# Patient Record
Sex: Female | Born: 1955 | Race: White | Hispanic: No | Marital: Single | Smoking: Never smoker
Health system: Southern US, Community
[De-identification: ages and names within clinical notes are randomized; demographics above are authoritative.]

## PROBLEM LIST (undated history)

## (undated) DIAGNOSIS — H9190 Unspecified hearing loss, unspecified ear: Secondary | ICD-10-CM

## (undated) DIAGNOSIS — I10 Essential (primary) hypertension: Secondary | ICD-10-CM

## (undated) HISTORY — DX: Unspecified hearing loss, unspecified ear: H91.90

## (undated) HISTORY — DX: Essential (primary) hypertension: I10

---

## 2018-02-17 LAB — CBC AND DIFFERENTIAL: Neutrophils Absolute: 3

## 2018-02-23 LAB — CBC AND DIFFERENTIAL
HCT: 39 (ref 36–46)
Hemoglobin: 13.4 (ref 12.0–16.0)
Neutrophils Absolute: 3
Platelets: 278 (ref 150–399)
WBC: 5.4

## 2018-02-23 LAB — BASIC METABOLIC PANEL
BUN: 12 (ref 4–21)
CO2: 22 (ref 13–22)
Chloride: 100 (ref 99–108)
Creatinine: 0.6 (ref <=1.1)
Glucose: 99
Potassium: 4.2 (ref 3.4–5.3)
Sodium: 139 (ref 137–147)

## 2018-02-23 LAB — COMPREHENSIVE METABOLIC PANEL
Albumin: 4.2 (ref 3.5–5.0)
Calcium: 9.3 (ref 8.7–10.7)
GFR calc Af Amer: 117
GFR calc non Af Amer: 102
Globulin: 2.5

## 2018-02-23 LAB — HEPATIC FUNCTION PANEL
ALT: 22 (ref 7–35)
AST: 24 (ref 13–35)
Alkaline Phosphatase: 73 (ref 25–125)
Bilirubin, Total: 0.5

## 2018-02-23 LAB — TSH: TSH: 1.73 (ref <=5.90)

## 2018-02-23 LAB — LIPID PANEL
Cholesterol: 194 (ref 0–200)
HDL: 76 — AB (ref 35–70)
LDL Cholesterol: 110
Triglycerides: 41 (ref 40–160)

## 2018-02-23 LAB — VITAMIN D 25 HYDROXY (VIT D DEFICIENCY, FRACTURES): Vit D, 25-Hydroxy: 51.7

## 2018-02-23 LAB — CBC: RBC: 4.14 (ref 3.87–5.11)

## 2019-02-14 LAB — CBC AND DIFFERENTIAL
HCT: 38 (ref 36–46)
Hemoglobin: 13.5 (ref 12.0–16.0)
Platelets: 316 (ref 150–399)
WBC: 5.7

## 2019-02-14 LAB — TSH: TSH: 1.14 (ref <=5.90)

## 2019-02-14 LAB — VITAMIN D 25 HYDROXY (VIT D DEFICIENCY, FRACTURES): Vit D, 25-Hydroxy: 75.2

## 2019-02-14 LAB — HEMOGLOBIN A1C: Hemoglobin A1C: 5.4

## 2019-02-14 LAB — CBC: RBC: 4.17 (ref 3.87–5.11)

## 2019-03-22 ENCOUNTER — Encounter (INDEPENDENT_AMBULATORY_CARE_PROVIDER_SITE_OTHER): Payer: BC Managed Care – PPO | Admitting: Family Medicine

## 2019-03-22 ENCOUNTER — Encounter: Payer: Self-pay | Admitting: Family Medicine

## 2019-03-22 ENCOUNTER — Ambulatory Visit: Payer: BC Managed Care – PPO | Admitting: Family Medicine

## 2019-03-22 ENCOUNTER — Other Ambulatory Visit: Payer: Self-pay

## 2019-03-22 VITALS — BP 130/78 | HR 62 | Temp 98.0°F | Ht 61.0 in | Wt 112.8 lb

## 2019-03-22 DIAGNOSIS — H9192 Unspecified hearing loss, left ear: Secondary | ICD-10-CM | POA: Diagnosis not present

## 2019-03-22 DIAGNOSIS — M8588 Other specified disorders of bone density and structure, other site: Secondary | ICD-10-CM | POA: Diagnosis not present

## 2019-03-22 DIAGNOSIS — M85859 Other specified disorders of bone density and structure, unspecified thigh: Secondary | ICD-10-CM | POA: Insufficient documentation

## 2019-03-22 DIAGNOSIS — Z1231 Encounter for screening mammogram for malignant neoplasm of breast: Secondary | ICD-10-CM

## 2019-03-22 DIAGNOSIS — H8109 Meniere's disease, unspecified ear: Secondary | ICD-10-CM

## 2019-03-22 DIAGNOSIS — Z0000 Encounter for general adult medical examination without abnormal findings: Secondary | ICD-10-CM

## 2019-03-22 NOTE — Patient Instructions (Signed)
DEXA Mammogram Derm referral

## 2019-03-22 NOTE — Progress Notes (Signed)
Established Patient Office Visit  Subjective:  Patient ID: Jamie Newman, female    DOB: 1955-11-16  Age: 63 y.o. MRN: [id number]  CC:  Chief Complaint  Patient presents with  . Establish Care    HPI Jamie Newman presents for  Social History   Socioeconomic History  . Marital status: Single    Spouse name: Not on file  . Number of children: Not on file  . Years of education: Not on file  . Highest education level: Not on file  Occupational History  . Not on file  Social Needs  . Financial resource strain: Not on file  . Food insecurity    Worry: Not on file    Inability: Not on file  . Transportation needs    Medical: Not on file    Non-medical: Not on file  Tobacco Use  . Smoking status: Not on file  Substance and Sexual Activity  . Alcohol use: Not on file  . Drug use: Not on file  . Sexual activity: Not on file  Lifestyle  . Physical activity    Days per week: Not on file    Minutes per session: Not on file  . Stress: Not on file  Relationships  . Social Herbalist on phone: Not on file    Gets together: Not on file    Attends religious service: Not on file    Active member of club or organization: Not on file    Attends meetings of clubs or organizations: Not on file    Relationship status: Not on file  . Intimate partner violence    Fear of current or ex partner: Not on file    Emotionally abused: Not on file    Physically abused: Not on file    Forced sexual activity: Not on file  Other Topics Concern  . Not on file  Social History Narrative  . Not on file    ROS Review of Systems  Constitutional: Negative.   HENT: Positive for hearing loss and tinnitus.   Eyes: Negative.   Gastrointestinal: Negative.   Endocrine: Negative.   Genitourinary:       Estrogen/progesterone/testosterone-weaning  Musculoskeletal: Negative.   Skin: Negative.   Allergic/Immunologic: Negative.   Neurological:       Vertigo  Hematological: Negative.    Psychiatric/Behavioral: Negative.       Objective:    Physical Exam  Constitutional: She is oriented to person, place, and time. She appears well-developed and well-nourished. No distress.  HENT:  Head: Normocephalic and atraumatic.  Eyes: Conjunctivae are normal.  Neck: Normal range of motion. Neck supple.  Cardiovascular: Normal rate and regular rhythm.  Pulmonary/Chest: Effort normal and breath sounds normal.  Abdominal: Bowel sounds are normal.  Musculoskeletal: Normal range of motion.  Neurological: She is oriented to person, place, and time.  Psychiatric: She has a normal mood and affect.    BP 130/78 (BP Location: Left Arm, Patient Position: Sitting, Cuff Size: Normal)   Pulse 62   Temp 98 F (36.7 C) (Oral)   Ht 5\' 1"  (1.549 m)   Wt 112 lb 12.8 oz (51.2 kg)   SpO2 97%   BMI 21.31 kg/m  Wt Readings from Last 3 Encounters:  03/22/19 112 lb 12.8 oz (51.2 kg)     Health Maintenance Due  Topic Date Due  . Hepatitis C Screening  16-May-1955  . HIV Screening  12/28/1972  . TETANUS/TDAP  12/28/1976  . PAP SMEAR-Modifier  12/29/1978  . MAMMOGRAM  12/29/2007  . COLONOSCOPY  12/29/2007  . INFLUENZA VACCINE  11/18/2018    Assessment & Plan:   1. Encounter for screening mammogram for malignant neoplasm of breast - MM Digital Screening; Future - DG DXA FRACTURE ASSESSMENT; Future 2. Osteopenia of lumbar spine DEXA-rx 3. Health maintenance examination - Ambulatory referral to Dermatology 4. Hearing loss of left ear, unspecified hearing loss type Causes vertigo in the past-seen by ENT-took valium for treatment  5. Meniere's disease, unspecified laterality Follow-up: as needed   Jamie Clinkenbeard Mat Carne, MD

## 2019-03-24 DIAGNOSIS — H9192 Unspecified hearing loss, left ear: Secondary | ICD-10-CM | POA: Insufficient documentation

## 2019-03-24 DIAGNOSIS — H8109 Meniere's disease, unspecified ear: Secondary | ICD-10-CM | POA: Insufficient documentation

## 2019-05-09 ENCOUNTER — Other Ambulatory Visit: Payer: Self-pay | Admitting: Family Medicine

## 2019-05-09 DIAGNOSIS — M8580 Other specified disorders of bone density and structure, unspecified site: Secondary | ICD-10-CM

## 2019-05-16 ENCOUNTER — Ambulatory Visit (HOSPITAL_COMMUNITY)
Admission: RE | Admit: 2019-05-16 | Discharge: 2019-05-16 | Disposition: A | Discharge status: Home / Self Care | Payer: BC Managed Care – PPO | Source: Ambulatory Visit | Attending: Family Medicine | Admitting: Family Medicine

## 2019-05-16 ENCOUNTER — Other Ambulatory Visit: Payer: Self-pay

## 2019-05-16 DIAGNOSIS — M8580 Other specified disorders of bone density and structure, unspecified site: Secondary | ICD-10-CM

## 2019-05-16 DIAGNOSIS — Z780 Asymptomatic menopausal state: Secondary | ICD-10-CM | POA: Insufficient documentation

## 2019-05-16 DIAGNOSIS — Z13820 Encounter for screening for osteoporosis: Secondary | ICD-10-CM | POA: Diagnosis not present

## 2019-05-16 DIAGNOSIS — Z1231 Encounter for screening mammogram for malignant neoplasm of breast: Secondary | ICD-10-CM

## 2019-05-17 ENCOUNTER — Telehealth: Payer: Self-pay | Admitting: Family Medicine

## 2019-05-17 NOTE — Telephone Encounter (Signed)
Patient is calling and requesting the results from her Bone Density and would like the comparison from her last scan.

## 2019-05-17 NOTE — Telephone Encounter (Signed)
Patient is scheduled for a virtual visit on Monday 05/21/2019

## 2019-05-21 ENCOUNTER — Telehealth (INDEPENDENT_AMBULATORY_CARE_PROVIDER_SITE_OTHER): Payer: BC Managed Care – PPO | Admitting: Family Medicine

## 2019-05-21 ENCOUNTER — Other Ambulatory Visit: Payer: Self-pay

## 2019-05-21 VITALS — BP 130/78 | Ht 61.0 in | Wt 112.0 lb

## 2019-05-21 DIAGNOSIS — M8588 Other specified disorders of bone density and structure, other site: Secondary | ICD-10-CM

## 2019-05-21 DIAGNOSIS — M85859 Other specified disorders of bone density and structure, unspecified thigh: Secondary | ICD-10-CM | POA: Diagnosis not present

## 2019-05-21 NOTE — Patient Instructions (Signed)
Follow up for repeat Bone Density in 2 years  COVID-19 Vaccine Information can be found at: [email or web address] For questions related to vaccine distribution or appointments, please email [email or web address] or call [phone].

## 2019-05-21 NOTE — Progress Notes (Signed)
Virtual Visit via Telephone Note  I connected with Jamie Newman on 05/21/19 at  9:40 AM EST by telephone and verified that I am speaking with the correct person using two identifiers.DOB/address  Location: Patient: home Provider: office   I discussed the limitations, risks, security and privacy concerns of performing an evaluation and management service by telephone and the availability of in person appointments. I also discussed with the patient that there may be a patient responsible charge related to this service. The patient expressed understanding and agreed to proceed.   History of Present Illness: Osteopenia-DEXA scan-takes Vit D and Calcium  increasing her walking Observations/Objective: AP Spine L1-L4 05/16/2019 [age] Osteopenia -2.3 0.898 g/cm2  DualFemur Neck Left 05/16/2019 [age] Osteopenia -1.7 0.799 g/cm2 ASSESSMENT: BMD as determined from AP Spine L1-L4 is 0.898 g/cm2 with a T-Score of -2.3. This patient is considered osteopenic according to World Health Organization [facility]) criteria. AP Spine L1-L4 05/16/2019 [age] Osteopenia -2.3 0.898 g/cm2  DualFemur Neck Left 05/16/2019 [age] Osteopenia -1.7 0.799 g/cm2 ASSESSMENT: BMD as determined from AP Spine L1-L4 is 0.898 g/cm2 with a T-Score of -2.3. This patient is considered osteopenic according to World Health Organization [facility]) criteria.   Assessment and Plan: 1. Osteopenia of lumbar spine Ca/VIt D  2. Osteopenia of neck of femur, unspecified laterality Ca/Vit D-core strengthening  Follow Up Instructions: Repeat in 2 years Core strengthening discussed-Yoga, continue walking, upper body light weight training   I discussed the assessment and treatment plan with the patient. The patient was provided an opportunity to ask questions and all were answered. The patient agreed with the plan and demonstrated an understanding of the instructions.   I provided 7 minutes of non-face-to-face time during this encounter.   Cornie Herrington  Mat Carne, MD

## 2019-05-22 ENCOUNTER — Ambulatory Visit: Payer: BC Managed Care – PPO | Attending: Internal Medicine

## 2019-05-22 ENCOUNTER — Other Ambulatory Visit: Payer: Self-pay

## 2019-05-22 DIAGNOSIS — Z20822 Contact with and (suspected) exposure to covid-19: Secondary | ICD-10-CM

## 2019-05-23 LAB — NOVEL CORONAVIRUS, NAA: SARS-CoV-2, NAA: NOT DETECTED

## 2019-05-28 ENCOUNTER — Other Ambulatory Visit: Payer: Self-pay | Admitting: Family Medicine

## 2019-05-28 ENCOUNTER — Inpatient Hospital Stay
Admission: RE | Admit: 2019-05-28 | Discharge: 2019-05-28 | Disposition: A | Discharge status: Home / Self Care | Payer: Self-pay | Source: Ambulatory Visit | Attending: Family Medicine | Admitting: Family Medicine

## 2019-05-28 DIAGNOSIS — Z1231 Encounter for screening mammogram for malignant neoplasm of breast: Secondary | ICD-10-CM

## 2019-05-30 ENCOUNTER — Telehealth: Payer: Self-pay | Admitting: Family Medicine

## 2019-05-30 NOTE — Telephone Encounter (Signed)
Patient is calling and states that she had a Bone Density on 05/16/19 and states her insurance did not cover it because it was coded as bone disorder M85.89 she states that it shouldve been charged as a preventive screening as patient states she gets them done every 2 years.

## 2019-05-30 NOTE — Telephone Encounter (Signed)
If pt had a previous h/o osteopenia , DEXA is no longer a screening test but a diagnostic test

## 2019-05-30 NOTE — Telephone Encounter (Signed)
Routing to Dr. Judee Clara I dont do the coding

## 2019-06-04 NOTE — Telephone Encounter (Signed)
Can you call this patient. I coded for osteopenia rather than screening test since diagnostic rather than screening. Can you check on this and call back

## 2019-06-04 NOTE — Telephone Encounter (Signed)
Patient is calling back to check on this.

## 2019-06-08 ENCOUNTER — Telehealth: Payer: Self-pay

## 2019-06-08 NOTE — Telephone Encounter (Signed)
Called and lmom again - bill has gone to reprocess

## 2019-06-08 NOTE — Telephone Encounter (Signed)
Called pt and let her know we are having the bill re-processed.

## 2019-06-08 NOTE — Telephone Encounter (Signed)
PT LVM that she needed to talk to you regarding this

## 2019-06-08 NOTE — Telephone Encounter (Signed)
Called [facility] and they said this needs to be coded as M85.80 if it is Osteopenia.  I have emailed Belva Bertin in [facility] billing and asked her to re file the claim with the corrected code

## 2019-06-11 ENCOUNTER — Telehealth: Payer: Self-pay | Admitting: Family Medicine

## 2019-06-11 ENCOUNTER — Telehealth: Payer: Self-pay

## 2019-06-11 ENCOUNTER — Other Ambulatory Visit: Payer: Self-pay | Admitting: Emergency Medicine

## 2019-06-11 DIAGNOSIS — I10 Essential (primary) hypertension: Secondary | ICD-10-CM

## 2019-06-11 MED ORDER — LOSARTAN POTASSIUM 50 MG PO TABS: 50.0000 mg | ORAL_TABLET | Freq: Every day | ORAL | 1 refills | Status: AC

## 2019-06-11 NOTE — Telephone Encounter (Signed)
Screening mammogram unless the patient had abnormality? Do we need to talk with a biller about his patient?

## 2019-06-11 NOTE — Telephone Encounter (Signed)
Patient is calling and requesting a refill on losartan (COZAAR) 50 MG tablet   [facility] - [place], [place] - [address] Phone:  [phone]  Fax:  [phone]

## 2019-06-11 NOTE — Telephone Encounter (Signed)
Sent note to billing per Dr Judee Clara

## 2019-06-11 NOTE — Telephone Encounter (Signed)
Rx has been sent to the pharmacy

## 2019-06-11 NOTE — Telephone Encounter (Signed)
Pt called again and asked for the dx on her mammogram be corrected to diagnostic.   Can you check and let me know what DX you want to put on this referral.  She is having problems getting this paid.  She said the code is not correct.  I told her I can not change the code that the providers select the code.

## 2019-06-27 NOTE — Telephone Encounter (Signed)
Pt called again today.  I have sent another message to charge correction today and ask for this to be reviewed again.  I am not sure if it have been re billed or not.  E-mail sent again today.     06/27/19 charge correction email:  I do not think this has been corrected.  Patient called again today.  Can you please check on this and let me know the status?  The patient is getting a delinquent bill from [facility] that the insurance has not paid the claim.   "Per Dr Judee Clara this should have a DX of M85.80" Diagnostic  Jamie Newman [place] [id number] DOS 05/16/19  -------------------------------------------------------------------------------------- Jamie Newman [place] [id number] DOS 05/16/19  Had a bone density at [facility].  Per Dr Judee Clara this should have a DX of M85.80.  The procedure was not paid with the incorrect dx.  Please file a corrected claim and this claim should be paid.  Thanks for your help.    From: Jamie Newman [email or web address]>  Sent: Friday, June 08, 2019 1:12 PM To: Jamie Newman [email or web address]> Subject: RE: billing error  Good afternoon, Jamie Newman.   You will need to submit an update for correction to the charge correction [email or web address]) email box. Please provide the patient name, Guarantor ID, DOB, and DOS with the information provide below regarding the correction.   Thank you,   Jamie Newman, MHA, [facility] [place]   SW-Professional Billing SW Professional Billing Manager Office: [phone]  Fax: [phone] [email or web address]

## 2019-07-18 ENCOUNTER — Telehealth: Payer: Self-pay

## 2019-07-18 NOTE — Telephone Encounter (Signed)
Info back from billing.  Sent to Valentine Nifong 07/18/19  From: Lianne Cure [email or web address]>  Sent: Wednesday, July 18, 2019 10:34 AM To: Jory Sims @[email or web address]> Subject: Re: secure FW: billing error  This appears to be a hospital account and we do not handle those accounts.  This needs to be sent to [facility].    Thanks,   Lianne Cure [place]

## 2019-12-05 ENCOUNTER — Other Ambulatory Visit: Payer: BC Managed Care – PPO

## 2019-12-05 ENCOUNTER — Other Ambulatory Visit: Payer: Self-pay

## 2019-12-05 DIAGNOSIS — Z20822 Contact with and (suspected) exposure to covid-19: Secondary | ICD-10-CM

## 2019-12-06 LAB — SARS-COV-2, NAA 2 DAY TAT

## 2019-12-06 LAB — NOVEL CORONAVIRUS, NAA: SARS-CoV-2, NAA: NOT DETECTED

## 2020-01-01 ENCOUNTER — Other Ambulatory Visit: Payer: BC Managed Care – PPO

## 2020-01-01 ENCOUNTER — Other Ambulatory Visit: Payer: Self-pay

## 2020-01-01 DIAGNOSIS — Z20822 Contact with and (suspected) exposure to covid-19: Secondary | ICD-10-CM

## 2020-01-04 LAB — NOVEL CORONAVIRUS, NAA: SARS-CoV-2, NAA: NOT DETECTED

## 2020-01-04 LAB — SPECIMEN STATUS REPORT

## 2020-01-19 ENCOUNTER — Other Ambulatory Visit: Payer: BC Managed Care – PPO

## 2020-01-19 ENCOUNTER — Other Ambulatory Visit: Payer: Self-pay

## 2020-01-19 DIAGNOSIS — Z20822 Contact with and (suspected) exposure to covid-19: Secondary | ICD-10-CM

## 2020-01-21 LAB — SARS-COV-2, NAA 2 DAY TAT

## 2020-01-21 LAB — NOVEL CORONAVIRUS, NAA: SARS-CoV-2, NAA: NOT DETECTED

## 2020-03-22 ENCOUNTER — Other Ambulatory Visit: Payer: BC Managed Care – PPO

## 2020-03-22 DIAGNOSIS — Z20822 Contact with and (suspected) exposure to covid-19: Secondary | ICD-10-CM

## 2020-03-24 LAB — SARS-COV-2, NAA 2 DAY TAT

## 2020-03-24 LAB — NOVEL CORONAVIRUS, NAA: SARS-CoV-2, NAA: NOT DETECTED

## 2020-04-03 ENCOUNTER — Other Ambulatory Visit: Payer: BC Managed Care – PPO

## 2020-04-03 DIAGNOSIS — Z20822 Contact with and (suspected) exposure to covid-19: Secondary | ICD-10-CM

## 2020-04-04 LAB — NOVEL CORONAVIRUS, NAA: SARS-CoV-2, NAA: NOT DETECTED

## 2020-04-04 LAB — SARS-COV-2, NAA 2 DAY TAT

## 2020-04-22 ENCOUNTER — Other Ambulatory Visit: Payer: Self-pay

## 2020-04-22 DIAGNOSIS — Z20822 Contact with and (suspected) exposure to covid-19: Secondary | ICD-10-CM

## 2020-04-24 LAB — SARS-COV-2, NAA 2 DAY TAT

## 2020-04-24 LAB — NOVEL CORONAVIRUS, NAA: SARS-CoV-2, NAA: DETECTED — AB

## 2020-04-27 ENCOUNTER — Other Ambulatory Visit: Payer: Self-pay

## 2020-10-17 ENCOUNTER — Other Ambulatory Visit (HOSPITAL_COMMUNITY): Payer: Self-pay | Admitting: Internal Medicine

## 2020-10-17 DIAGNOSIS — Z1231 Encounter for screening mammogram for malignant neoplasm of breast: Secondary | ICD-10-CM

## 2020-10-30 ENCOUNTER — Ambulatory Visit (HOSPITAL_COMMUNITY): Payer: Self-pay

## 2020-10-31 ENCOUNTER — Other Ambulatory Visit: Payer: Self-pay

## 2020-10-31 ENCOUNTER — Ambulatory Visit (HOSPITAL_COMMUNITY)
Admission: RE | Admit: 2020-10-31 | Discharge: 2020-10-31 | Disposition: A | Discharge status: Home / Self Care | Payer: BC Managed Care – PPO | Source: Ambulatory Visit | Attending: Internal Medicine | Admitting: Internal Medicine

## 2020-10-31 DIAGNOSIS — Z1231 Encounter for screening mammogram for malignant neoplasm of breast: Secondary | ICD-10-CM | POA: Diagnosis present

## 2020-11-03 ENCOUNTER — Ambulatory Visit (HOSPITAL_COMMUNITY): Payer: Self-pay

## 2021-05-14 ENCOUNTER — Other Ambulatory Visit (HOSPITAL_COMMUNITY): Payer: Self-pay | Admitting: Family Medicine

## 2021-05-14 DIAGNOSIS — Z13820 Encounter for screening for osteoporosis: Secondary | ICD-10-CM

## 2021-08-12 ENCOUNTER — Other Ambulatory Visit: Payer: Self-pay

## 2021-08-12 ENCOUNTER — Emergency Department (HOSPITAL_BASED_OUTPATIENT_CLINIC_OR_DEPARTMENT_OTHER): Payer: BC Managed Care – PPO

## 2021-08-12 ENCOUNTER — Emergency Department (HOSPITAL_BASED_OUTPATIENT_CLINIC_OR_DEPARTMENT_OTHER)
Admission: EM | Admit: 2021-08-12 | Discharge: 2021-08-12 | Disposition: A | Discharge status: Home / Self Care | Payer: BC Managed Care – PPO | Source: Home / Self Care | Attending: Emergency Medicine | Admitting: Emergency Medicine

## 2021-08-12 ENCOUNTER — Encounter (HOSPITAL_BASED_OUTPATIENT_CLINIC_OR_DEPARTMENT_OTHER): Payer: Self-pay

## 2021-08-12 DIAGNOSIS — N151 Renal and perinephric abscess: Secondary | ICD-10-CM | POA: Insufficient documentation

## 2021-08-12 DIAGNOSIS — N12 Tubulo-interstitial nephritis, not specified as acute or chronic: Secondary | ICD-10-CM | POA: Insufficient documentation

## 2021-08-12 LAB — CBC WITH DIFFERENTIAL/PLATELET
Abs Immature Granulocytes: 0.06 10*3/uL (ref 0.00–0.07)
Basophils Absolute: 0 10*3/uL (ref 0.0–0.1)
Basophils Relative: 0 %
Eosinophils Absolute: 0 10*3/uL (ref 0.0–0.5)
Eosinophils Relative: 0 %
HCT: 38 % (ref 36.0–46.0)
Hemoglobin: 12.9 g/dL (ref 12.0–15.0)
Immature Granulocytes: 1 %
Lymphocytes Relative: 9 %
Lymphs Abs: 1.2 10*3/uL (ref 0.7–4.0)
MCH: 31.6 pg (ref 26.0–34.0)
MCHC: 33.9 g/dL (ref 30.0–36.0)
MCV: 93.1 fL (ref 80.0–100.0)
Monocytes Absolute: 1.1 10*3/uL — ABNORMAL HIGH (ref 0.1–1.0)
Monocytes Relative: 9 %
Neutro Abs: 10.3 10*3/uL — ABNORMAL HIGH (ref 1.7–7.7)
Neutrophils Relative %: 81 %
Platelets: 257 10*3/uL (ref 150–400)
RBC: 4.08 MIL/uL (ref 3.87–5.11)
RDW: 12 % (ref 11.5–15.5)
WBC: 12.7 10*3/uL — ABNORMAL HIGH (ref 4.0–10.5)
nRBC: 0 % (ref 0.0–0.2)

## 2021-08-12 LAB — COMPREHENSIVE METABOLIC PANEL
ALT: 34 U/L (ref 0–44)
AST: 33 U/L (ref 15–41)
Albumin: 4.4 g/dL (ref 3.5–5.0)
Alkaline Phosphatase: 79 U/L (ref 38–126)
Anion gap: 13 (ref 5–15)
BUN: 13 mg/dL (ref 8–23)
CO2: 23 mmol/L (ref 22–32)
Calcium: 10 mg/dL (ref 8.9–10.3)
Chloride: 98 mmol/L (ref 98–111)
Creatinine, Ser: 0.56 mg/dL (ref 0.44–1.00)
GFR, Estimated: >60 mL/min (ref >60)
Glucose, Bld: 100 mg/dL — ABNORMAL HIGH (ref 70–99)
Potassium: 3.8 mmol/L (ref 3.5–5.1)
Sodium: 134 mmol/L — ABNORMAL LOW (ref 135–145)
Total Bilirubin: 1 mg/dL (ref 0.3–1.2)
Total Protein: 7.7 g/dL (ref 6.5–8.1)

## 2021-08-12 LAB — URINALYSIS, ROUTINE W REFLEX MICROSCOPIC
Bilirubin Urine: NEGATIVE
Glucose, UA: NEGATIVE mg/dL
Hgb urine dipstick: NEGATIVE
Nitrite: NEGATIVE
Specific Gravity, Urine: 1.015 (ref 1.005–1.030)
pH: 6 (ref 5.0–8.0)

## 2021-08-12 LAB — LACTIC ACID, PLASMA: Lactic Acid, Venous: 1 mmol/L (ref 0.5–1.9)

## 2021-08-12 LAB — LIPASE, BLOOD: Lipase: 10 U/L — ABNORMAL LOW (ref 11–51)

## 2021-08-12 MED ORDER — SODIUM CHLORIDE 0.9 % IV SOLN
2.0000 g | Freq: Once | INTRAVENOUS | Status: AC
  Administered 2021-08-12: 2 g via INTRAVENOUS
  Filled 2021-08-12: qty 20

## 2021-08-12 MED ORDER — SODIUM CHLORIDE 0.9 % IV BOLUS
1000.0000 mL | Freq: Once | INTRAVENOUS | Status: AC
  Administered 2021-08-12: 1000 mL via INTRAVENOUS

## 2021-08-12 MED ORDER — FENTANYL CITRATE PF 50 MCG/ML IJ SOSY
50.0000 ug | PREFILLED_SYRINGE | Freq: Once | INTRAMUSCULAR | Status: DC
  Filled 2021-08-12: qty 1

## 2021-08-12 MED ORDER — IOHEXOL 300 MG/ML  SOLN
100.0000 mL | Freq: Once | INTRAMUSCULAR | Status: AC | PRN
  Administered 2021-08-12: 75 mL via INTRAVENOUS

## 2021-08-12 MED ORDER — HYDROCODONE-ACETAMINOPHEN 5-325 MG PO TABS: 1.0000 | ORAL_TABLET | ORAL | 0 refills | Status: DC | PRN

## 2021-08-12 MED ORDER — CEPHALEXIN 500 MG PO CAPS: 500.0000 mg | ORAL_CAPSULE | Freq: Three times a day (TID) | ORAL | 0 refills | Status: DC

## 2021-08-12 MED ORDER — ONDANSETRON HCL 4 MG/2ML IJ SOLN
4.0000 mg | Freq: Once | INTRAMUSCULAR | Status: DC
  Filled 2021-08-12: qty 2

## 2021-08-12 NOTE — ED Triage Notes (Signed)
Patient here POV from Home. ? ?Patient seen by PCP on Saturday for Urinary Frequency and Bilateral Flank Pain. Tested for UTI with Unremarkable Results. Pain is Wave and Spasm Like.  ? ?Chills this AM. No Fevers at Home. Mild Nausea. No Emesis. No Diarrhea.  ? ?NAD Noted during Triage. A&Ox4. GCS 15. Ambulatory. ?

## 2021-08-12 NOTE — ED Provider Notes (Signed)
Past Medical History:  ?Diagnosis Date  ? Hearing loss   ? Hypertension   ?  ?Physical Exam  ?BP 138/77   Pulse (!) 103   Temp 98.3 ?F (36.8 ?C) (Oral)   Resp 15   Ht 5\' 1"  (1.549 m)   Wt 50.8 kg   SpO2 99%   BMI 21.16 kg/m?  ? ?Physical Exam ? ?Procedures  ?Procedures ? ?ED Course / MDM  ?  ?Medical Decision Making ?Amount and/or Complexity of Data Reviewed ?Labs: ordered. ?Radiology: ordered. ? ?Risk ?Prescription drug management. ? ?Received care of patient at 330 PM.  Please see prior note for history, physical and care.  Briefly this is a 66 year old female who presented with concern for flank pain and urinary frequency.  She is found to have a urinary tract infection, with plan to treat for pyelonephritis.  She is given a dose of Rocephin.  ?Lab work was completed and evaluated by me and showed a leukocytosis of 12.7, normal renal function, UA consistent with infection. ? ?Her CT was personally evaluated by me and shows a cortical low-tension mediation areas in the left kidney with mild urothelial enhancement concerning for pyelonephritis, and it was 7.5 mm hypodense rounded area in the upper pole which may reflect a small intrarenal abscess versus cyst. ? ?Discussed with Dr. 64 of urology, who did not feel that she required admission based on imaging findings if we do not see other indication for admission. ? ?Discussed option for admission with patient versus outpatient management.  She is afebrile, with heart rate primarily in the 90s, normal blood pressure, normal lactic acid.  She is receiving a dose of IV rocephin in the ED. Discussed given this and discussion with Urology outpatient management with strict return precautions os appropriate. Given rx for keflex, pain medication after review in database.  Patient discharged in stable condition with understanding of reasons to return.  ? ? ? ? ? ?  ?Retta Diones, MD ?08/13/21 0036 ? ?

## 2021-08-12 NOTE — ED Provider Notes (Signed)
?[facility] EMERGENCY DEPT ?Provider Note ? ? ?CSN: [id number] ?Arrival date & time: 08/12/21  1224 ? ?  ? ?History ? ?Chief Complaint  ?Patient presents with  ? Urinary Frequency  ? ? ?Jamie Newman is a 66 y.o. female.  Presents to ER due to concern for urinary frequency, flank pain.  Patient states that she has had some burning with urination, pain with urination since Saturday.  Then developed pain on both of her [place].  Pain comes in waves, spasms.  Had some chills but no fevers.  Slight nausea but no vomiting.  Currently pain is mild to moderate.  States that she was tested for UTI and was told she does not have a UTI.  Sent to ER for further evaluation. ? ?HPI ? ?  ? ?Home Medications ?Prior to Admission medications   ?Medication Sig Start Date End Date Taking? Authorizing Provider  ?cephALEXin (KEFLEX) 500 MG capsule Take 1 capsule (500 mg total) by mouth 3 (three) times daily for 14 days. 08/12/21 08/26/21 Yes Alvira MondaySchlossman, Erin, MD  ?HYDROcodone-acetaminophen (NORCO/VICODIN) 5-325 MG tablet Take 1 tablet by mouth every 4 (four) hours as needed. 08/12/21  Yes Alvira MondaySchlossman, Erin, MD  ?albuterol (VENTOLIN HFA) 108 (90 Base) MCG/ACT inhaler 1-2 puffs qid prn wheezing 03/16/10   [provider]  ?Cholecalciferol (KP VITAMIN D) 25 MCG (1000 UT) capsule 2,000 Units daily.    [provider]  ?diazepam (VALIUM) 2 MG tablet Take 2 mg by mouth daily. 10/12/16   [provider]  ?diazepam (VALIUM) 2 MG tablet Take 2 mg by mouth daily. 02/27/19   [provider]  ?EPINEPHrine 0.3 mg/0.3 mL IJ SOAJ injection as directed 12/05/08   [provider]  ?L-Lysine 1000 MG TABS Take by mouth.    [provider]  ?losartan (COZAAR) 50 MG tablet Take 1 tablet (50 mg total) by mouth daily. 06/11/19   Wandra Feinsteinorum, Lisa L, MD  ?Multiple Vitamin (MULTIVITAMIN) capsule Take by mouth.    [provider]  ?   ? ?Allergies    ?Morphine and related and Codeine   ? ?Review of  Systems   ?Review of Systems  ?Constitutional:  Negative for chills and fever.  ?HENT:  Negative for ear pain and sore throat.   ?Eyes:  Negative for pain and visual disturbance.  ?Respiratory:  Negative for cough and shortness of breath.   ?Cardiovascular:  Negative for chest pain and palpitations.  ?Gastrointestinal:  Negative for abdominal pain and vomiting.  ?Genitourinary:  Positive for flank pain. Negative for dysuria and hematuria.  ?Musculoskeletal:  Negative for arthralgias and back pain.  ?Skin:  Negative for color change and rash.  ?Neurological:  Negative for seizures and syncope.  ?All other systems reviewed and are negative. ? ?Physical Exam ?Updated Vital Signs ?BP 137/78   Pulse 97   Temp 98.3 ?F (36.8 ?C) (Oral)   Resp 16   Ht 5\' 1"  (1.549 m)   Wt 50.8 kg   SpO2 96%   BMI 21.16 kg/m?  ?Physical Exam ?Vitals and nursing note reviewed.  ?Constitutional:   ?   General: She is not in acute distress. ?   Appearance: She is well-developed.  ?HENT:  ?   Head: Normocephalic and atraumatic.  ?Eyes:  ?   Conjunctiva/sclera: Conjunctivae normal.  ?Cardiovascular:  ?   Rate and Rhythm: Normal rate and regular rhythm.  ?   Heart sounds: No murmur heard. ?Pulmonary:  ?   Effort: Pulmonary effort is normal. No  respiratory distress.  ?   Breath sounds: Normal breath sounds.  ?Abdominal:  ?   Palpations: Abdomen is soft.  ?   Tenderness: There is no abdominal tenderness.  ?   Comments: Tenderness palpation left lower quadrant, no rebound or guarding  ?Musculoskeletal:     ?   General: No swelling, tenderness or deformity.  ?   Cervical back: Neck supple.  ?Skin: ?   General: Skin is warm and dry.  ?   Capillary Refill: Capillary refill takes less than 2 seconds.  ?Neurological:  ?   General: No focal deficit present.  ?   Mental Status: She is alert.  ?Psychiatric:     ?   Mood and Affect: Mood normal.  ? ? ?ED Results / Procedures / Treatments   ?Labs ?(all labs ordered are listed, but only abnormal results  are displayed) ?Labs Reviewed  ?URINE CULTURE - Abnormal; Notable for the following components:  ?    Result Value  ? Culture >=100,000 COLONIES/mL ESCHERICHIA COLI (*)   ? Organism ID, Bacteria ESCHERICHIA COLI (*)   ? All other components within normal limits  ?CBC WITH DIFFERENTIAL/PLATELET - Abnormal; Notable for the following components:  ? WBC 12.7 (*)   ? Neutro Abs 10.3 (*)   ? Monocytes Absolute 1.1 (*)   ? All other components within normal limits  ?COMPREHENSIVE METABOLIC PANEL - Abnormal; Notable for the following components:  ? Sodium 134 (*)   ? Glucose, Bld 100 (*)   ? All other components within normal limits  ?LIPASE, BLOOD - Abnormal; Notable for the following components:  ? Lipase 10 (*)   ? All other components within normal limits  ?URINALYSIS, ROUTINE W REFLEX MICROSCOPIC - Abnormal; Notable for the following components:  ? Ketones, ur TRACE (*)   ? Protein, ur TRACE (*)   ? Leukocytes,Ua MODERATE (*)   ? Bacteria, UA FEW (*)   ? Non Squamous Epithelial 0-5 (*)   ? All other components within normal limits  ?CULTURE, BLOOD (ROUTINE X 2)  ?CULTURE, BLOOD (ROUTINE X 2)  ?LACTIC ACID, PLASMA  ? ? ?EKG ?None ? ?Radiology ?CT ABDOMEN PELVIS W CONTRAST ? ?Result Date: 08/12/2021 ?CLINICAL DATA:  Abdominal pain, bilateral flank pain for 4 days EXAM: CT ABDOMEN AND PELVIS WITH CONTRAST TECHNIQUE: Multidetector CT imaging of the abdomen and pelvis was performed using the standard protocol following bolus administration of intravenous contrast. RADIATION DOSE REDUCTION: This exam was performed according to the departmental dose-optimization program which includes automated exposure control, adjustment of the mA and/or kV according to patient size and/or use of iterative reconstruction technique. CONTRAST:  66mL OMNIPAQUE IOHEXOL 300 MG/ML  SOLN COMPARISON:  None. FINDINGS: Lower chest: No acute abnormality. Hepatobiliary: No focal liver abnormality is seen. No gallstones, gallbladder wall thickening, or  biliary dilatation. Pancreas: Unremarkable. No pancreatic ductal dilatation or surrounding inflammatory changes. Spleen: Normal in size without focal abnormality. Adrenals/Urinary Tract: Adrenal glands are unremarkable. Cortical low attenuation areas in the left kidney with mild urothelial enhancement concerning for pyelonephritis. 7.5 mm hypodense rounded area in the upper pole of the left kidney which may reflect a small intrarenal abscess versus cyst. Relative bladder wall thickening which may be secondary to underdistention versus cystitis. Stomach/Bowel: Stomach is within normal limits. No evidence of bowel wall thickening, distention, or inflammatory changes. Appendix is normal. Diverticulosis without evidence of diverticulitis. Vascular/Lymphatic: No significant vascular findings are present. No enlarged abdominal or pelvic lymph nodes. Reproductive: Uterus and bilateral adnexa  are unremarkable. Other: No abdominal wall hernia or abnormality. No abdominopelvic ascites. Musculoskeletal: No acute osseous abnormality. No aggressive osseous lesion. Bilateral facet arthropathy at L4-5 and L5-S1. IMPRESSION: 1. Cortical low attenuation areas in the left kidney with mild urothelial enhancement concerning for pyelonephritis. 7.5 mm hypodense rounded area in the upper pole of the left kidney which may reflect a small intrarenal abscess versus cyst. 2. Relative bladder wall thickening which may be secondary to underdistention versus cystitis. Electronically Signed   By: Elige Ko M.D.   On: 08/12/2021 13:58  ? ?DG Chest Port 1 View ? ?Result Date: 08/13/2021 ?CLINICAL DATA:  Sepsis, fever EXAM: PORTABLE CHEST 1 VIEW COMPARISON:  None. FINDINGS: The heart size and mediastinal contours are within normal limits. Both lungs are clear. The visualized skeletal structures are unremarkable. IMPRESSION: No active disease. Electronically Signed   By: Helyn Numbers M.D.   On: 08/13/2021 21:57   ? ?Procedures ?Procedures   ? ? ?Medications Ordered in ED ?Medications  ?iohexol (OMNIPAQUE) 300 MG/ML solution 100 mL (75 mLs Intravenous Contrast Given 08/12/21 1339)  ?cefTRIAXone (ROCEPHIN) 2 g in sodium chloride 0.9 % 100 mL IVPB (0 g Cecile Hearing

## 2021-08-13 ENCOUNTER — Encounter (HOSPITAL_COMMUNITY): Payer: Self-pay

## 2021-08-13 ENCOUNTER — Inpatient Hospital Stay (HOSPITAL_COMMUNITY)
Admission: EM | Admit: 2021-08-13 | Discharge: 2021-08-15 | DRG: 690 | Disposition: A | Discharge status: Home / Self Care | Payer: BC Managed Care – PPO | Attending: Family Medicine | Admitting: Family Medicine

## 2021-08-13 ENCOUNTER — Other Ambulatory Visit: Payer: Self-pay

## 2021-08-13 ENCOUNTER — Emergency Department (HOSPITAL_COMMUNITY): Payer: BC Managed Care – PPO

## 2021-08-13 DIAGNOSIS — N12 Tubulo-interstitial nephritis, not specified as acute or chronic: Principal | ICD-10-CM | POA: Diagnosis present

## 2021-08-13 DIAGNOSIS — D649 Anemia, unspecified: Secondary | ICD-10-CM | POA: Diagnosis not present

## 2021-08-13 DIAGNOSIS — I10 Essential (primary) hypertension: Secondary | ICD-10-CM | POA: Diagnosis present

## 2021-08-13 DIAGNOSIS — R739 Hyperglycemia, unspecified: Secondary | ICD-10-CM | POA: Diagnosis present

## 2021-08-13 DIAGNOSIS — N151 Renal and perinephric abscess: Secondary | ICD-10-CM | POA: Diagnosis present

## 2021-08-13 DIAGNOSIS — D509 Iron deficiency anemia, unspecified: Secondary | ICD-10-CM | POA: Diagnosis present

## 2021-08-13 DIAGNOSIS — E611 Iron deficiency: Secondary | ICD-10-CM | POA: Diagnosis present

## 2021-08-13 DIAGNOSIS — R7303 Prediabetes: Secondary | ICD-10-CM | POA: Diagnosis present

## 2021-08-13 DIAGNOSIS — E876 Hypokalemia: Secondary | ICD-10-CM | POA: Diagnosis present

## 2021-08-13 DIAGNOSIS — Z885 Allergy status to narcotic agent status: Secondary | ICD-10-CM

## 2021-08-13 DIAGNOSIS — Z8249 Family history of ischemic heart disease and other diseases of the circulatory system: Secondary | ICD-10-CM

## 2021-08-13 DIAGNOSIS — H9190 Unspecified hearing loss, unspecified ear: Secondary | ICD-10-CM | POA: Diagnosis present

## 2021-08-13 DIAGNOSIS — R7989 Other specified abnormal findings of blood chemistry: Secondary | ICD-10-CM | POA: Diagnosis present

## 2021-08-13 DIAGNOSIS — B9620 Unspecified Escherichia coli [E. coli] as the cause of diseases classified elsewhere: Secondary | ICD-10-CM | POA: Diagnosis present

## 2021-08-13 DIAGNOSIS — Z79899 Other long term (current) drug therapy: Secondary | ICD-10-CM

## 2021-08-13 LAB — CBC WITH DIFFERENTIAL/PLATELET
Abs Immature Granulocytes: 0.05 10*3/uL (ref 0.00–0.07)
Basophils Absolute: 0 10*3/uL (ref 0.0–0.1)
Basophils Relative: 0 %
Eosinophils Absolute: 0 10*3/uL (ref 0.0–0.5)
Eosinophils Relative: 0 %
HCT: 35.1 % — ABNORMAL LOW (ref 36.0–46.0)
Hemoglobin: 12.1 g/dL (ref 12.0–15.0)
Immature Granulocytes: 1 %
Lymphocytes Relative: 8 %
Lymphs Abs: 0.8 10*3/uL (ref 0.7–4.0)
MCH: 32.6 pg (ref 26.0–34.0)
MCHC: 34.5 g/dL (ref 30.0–36.0)
MCV: 94.6 fL (ref 80.0–100.0)
Monocytes Absolute: 0.8 10*3/uL (ref 0.1–1.0)
Monocytes Relative: 9 %
Neutro Abs: 7.7 10*3/uL (ref 1.7–7.7)
Neutrophils Relative %: 82 %
Platelets: 199 10*3/uL (ref 150–400)
RBC: 3.71 MIL/uL — ABNORMAL LOW (ref 3.87–5.11)
RDW: 12.4 % (ref 11.5–15.5)
WBC: 9.3 10*3/uL (ref 4.0–10.5)
nRBC: 0 % (ref 0.0–0.2)

## 2021-08-13 LAB — BASIC METABOLIC PANEL
Anion gap: 9 (ref 5–15)
BUN: 10 mg/dL (ref 8–23)
CO2: 23 mmol/L (ref 22–32)
Calcium: 8.8 mg/dL — ABNORMAL LOW (ref 8.9–10.3)
Chloride: 101 mmol/L (ref 98–111)
Creatinine, Ser: 0.56 mg/dL (ref 0.44–1.00)
GFR, Estimated: >60 mL/min (ref >60)
Glucose, Bld: 126 mg/dL — ABNORMAL HIGH (ref 70–99)
Potassium: 3.5 mmol/L (ref 3.5–5.1)
Sodium: 133 mmol/L — ABNORMAL LOW (ref 135–145)

## 2021-08-13 LAB — LACTIC ACID, PLASMA
Lactic Acid, Venous: 1 mmol/L (ref 0.5–1.9)
Lactic Acid, Venous: 0.8 mmol/L (ref 0.5–1.9)

## 2021-08-13 MED ORDER — ACETAMINOPHEN 325 MG PO TABS
650.0000 mg | ORAL_TABLET | Freq: Once | ORAL | Status: AC
Start: 2021-08-13 — End: 2021-08-13
  Administered 2021-08-13: 650 mg via ORAL
  Filled 2021-08-13: qty 2

## 2021-08-13 MED ORDER — SODIUM CHLORIDE 0.9 % IV BOLUS (SEPSIS)
1000.0000 mL | Freq: Once | INTRAVENOUS | Status: AC
  Administered 2021-08-13: 1000 mL via INTRAVENOUS

## 2021-08-13 MED ORDER — FENTANYL CITRATE PF 50 MCG/ML IJ SOSY
50.0000 ug | PREFILLED_SYRINGE | Freq: Once | INTRAMUSCULAR | Status: AC
  Administered 2021-08-13: 50 ug via INTRAVENOUS
  Filled 2021-08-13: qty 1

## 2021-08-13 MED ORDER — ONDANSETRON HCL 4 MG/2ML IJ SOLN
4.0000 mg | Freq: Once | INTRAMUSCULAR | Status: AC
Start: 2021-08-13 — End: 2021-08-13
  Administered 2021-08-13: 4 mg via INTRAVENOUS
  Filled 2021-08-13: qty 2

## 2021-08-13 MED ORDER — SODIUM CHLORIDE 0.9 % IV SOLN
2.0000 g | INTRAVENOUS | Status: DC
  Administered 2021-08-13 – 2021-08-15 (×3): 2 g via INTRAVENOUS
  Filled 2021-08-13 (×3): qty 20

## 2021-08-13 NOTE — ED Triage Notes (Signed)
Pt reports fever that started today.  Reports was seen yesterday and dx with pyelonephritis at [facility].  Pt was given rocephin and started on keflex po.  Pt reports worsening symptoms today with fever and bodyaches.  Resp even and unlabored.  Skin warm and dry.    ?

## 2021-08-13 NOTE — Progress Notes (Signed)
Sepsis tracking by eLINK 

## 2021-08-13 NOTE — ED Provider Notes (Signed)
?[facility] ?Provider Note ? ? ?CSN: [id number] ?Arrival date & time: 08/13/21  2020 ? ?  ? ?History ? ?Chief Complaint  ?Patient presents with  ? Fever  ? ? ?Jamie Newman is a 66 y.o. female. ? ? ?Fever ? ?This patient is a 66 year old female, she has a history of what appears to be pyelonephritis diagnosed yesterday in the emergency department, she had an elevated white blood cell count, a borderline tachycardia but a normal lactic acid.  She had a CT scan that showed that there was likely pyelonephritis, she elected to go home instead to be admitted to the hospital but unfortunately after 2 doses of cephalexin today she is just not improved.  She has had some weakness, some nausea, some left flank pain and is now feeling like she is having difficulty walking because she is very lightheaded, having fevers as high as close to 100 degrees and is having worsening body aches. ? ?Home Medications ?Prior to Admission medications   ?Medication Sig Start Date End Date Taking? Authorizing Provider  ?albuterol (VENTOLIN HFA) 108 (90 Base) MCG/ACT inhaler 1-2 puffs qid prn wheezing 03/16/10   [provider]  ?cephALEXin (KEFLEX) 500 MG capsule Take 1 capsule (500 mg total) by mouth 3 (three) times daily for 14 days. 08/12/21 08/26/21  Gareth Morgan, MD  ?Cholecalciferol (KP VITAMIN D) 25 MCG (1000 UT) capsule 2,000 Units daily.    [provider]  ?diazepam (VALIUM) 2 MG tablet Take 2 mg by mouth daily. 10/12/16   [provider]  ?diazepam (VALIUM) 2 MG tablet Take 2 mg by mouth daily. 02/27/19   [provider]  ?EPINEPHrine 0.3 mg/0.3 mL IJ SOAJ injection as directed 12/05/08   [provider]  ?HYDROcodone-acetaminophen (NORCO/VICODIN) 5-325 MG tablet Take 1 tablet by mouth every 4 (four) hours as needed. 08/12/21   Gareth Morgan, MD  ?L-Lysine 1000 MG TABS Take by mouth.    [provider]  ?losartan (COZAAR) 50 MG tablet Take 1 tablet (50 mg  total) by mouth daily. 06/11/19   Maryruth Hancock, MD  ?Multiple Vitamin (MULTIVITAMIN) capsule Take by mouth.    [provider]  ?   ? ?Allergies    ?Morphine and related and Codeine   ? ?Review of Systems   ?Review of Systems  ?Constitutional:  Positive for fever.  ?All other systems reviewed and are negative. ? ?Physical Exam ?Updated Vital Signs ?BP 124/81   Pulse 91   Temp 98.9 ?F (37.2 ?C) (Oral)   Resp 10   Ht 1.549 m (5\' 1" )   Wt 50.8 kg   SpO2 97%   BMI 21.16 kg/m?  ?Physical Exam ?Vitals and nursing note reviewed.  ?Constitutional:   ?   General: She is not in acute distress. ?   Appearance: She is well-developed.  ?HENT:  ?   Head: Normocephalic and atraumatic.  ?   Mouth/Throat:  ?   Pharynx: No oropharyngeal exudate.  ?Eyes:  ?   General: No scleral icterus.    ?   Right eye: No discharge.     ?   Left eye: No discharge.  ?   Conjunctiva/sclera: Conjunctivae normal.  ?   Pupils: Pupils are equal, round, and reactive to light.  ?Neck:  ?   Thyroid: No thyromegaly.  ?   Vascular: No JVD.  ?Cardiovascular:  ?   Rate and Rhythm: Normal rate and regular rhythm.  ?   Heart sounds: Normal heart sounds.  No murmur heard. ?  No friction rub. No gallop.  ?Pulmonary:  ?   Effort: Pulmonary effort is normal. No respiratory distress.  ?   Breath sounds: Normal breath sounds. No wheezing or rales.  ?Abdominal:  ?   General: Bowel sounds are normal. There is no distension.  ?   Palpations: Abdomen is soft. There is no mass.  ?   Tenderness: There is abdominal tenderness.  ?   Comments: CVA tenderness on the left, suprapubic tenderness, nonperitoneal  ?Musculoskeletal:     ?   General: No tenderness. Normal range of motion.  ?   Cervical back: Normal range of motion and neck supple.  ?   Right lower leg: No edema.  ?   Left lower leg: No edema.  ?Lymphadenopathy:  ?   Cervical: No cervical adenopathy.  ?Skin: ?   General: Skin is warm and dry.  ?   Findings: No erythema or rash.  ?Neurological:  ?   Mental  Status: She is alert.  ?   Coordination: Coordination normal.  ?Psychiatric:     ?   Behavior: Behavior normal.  ? ? ?ED Results / Procedures / Treatments   ?Labs ?(all labs ordered are listed, but only abnormal results are displayed) ?Labs Reviewed  ?CBC WITH DIFFERENTIAL/PLATELET - Abnormal; Notable for the following components:  ?    Result Value  ? RBC 3.71 (*)   ? HCT 35.1 (*)   ? All other components within normal limits  ?BASIC METABOLIC PANEL - Abnormal; Notable for the following components:  ? Sodium 133 (*)   ? Glucose, Bld 126 (*)   ? Calcium 8.8 (*)   ? All other components within normal limits  ?LACTIC ACID, PLASMA  ?LACTIC ACID, PLASMA  ? ? ?EKG ?EKG Interpretation ? ?Date/Time:  Thursday August 13 2021 21:20:10 EDT ?Ventricular Rate:  88 ?PR Interval:  135 ?QRS Duration: 118 ?QT Interval:  364 ?QTC Calculation: 441 ?R Axis:   69 ?Text Interpretation: Sinus rhythm Nonspecific intraventricular conduction delay Low voltage, precordial leads Confirmed by Noemi Chapel [phone]) on 08/13/2021 10:10:57 PM ? ?Radiology ?CT ABDOMEN PELVIS W CONTRAST ? ?Result Date: 08/12/2021 ?CLINICAL DATA:  Abdominal pain, bilateral flank pain for 4 days EXAM: CT ABDOMEN AND PELVIS WITH CONTRAST TECHNIQUE: Multidetector CT imaging of the abdomen and pelvis was performed using the standard protocol following bolus administration of intravenous contrast. RADIATION DOSE REDUCTION: This exam was performed according to the departmental dose-optimization program which includes automated exposure control, adjustment of the mA and/or kV according to patient size and/or use of iterative reconstruction technique. CONTRAST:  77mL OMNIPAQUE IOHEXOL 300 MG/ML  SOLN COMPARISON:  None. FINDINGS: Lower chest: No acute abnormality. Hepatobiliary: No focal liver abnormality is seen. No gallstones, gallbladder wall thickening, or biliary dilatation. Pancreas: Unremarkable. No pancreatic ductal dilatation or surrounding inflammatory changes.  Spleen: Normal in size without focal abnormality. Adrenals/Urinary Tract: Adrenal glands are unremarkable. Cortical low attenuation areas in the left kidney with mild urothelial enhancement concerning for pyelonephritis. 7.5 mm hypodense rounded area in the upper pole of the left kidney which may reflect a small intrarenal abscess versus cyst. Relative bladder wall thickening which may be secondary to underdistention versus cystitis. Stomach/Bowel: Stomach is within normal limits. No evidence of bowel wall thickening, distention, or inflammatory changes. Appendix is normal. Diverticulosis without evidence of diverticulitis. Vascular/Lymphatic: No significant vascular findings are present. No enlarged abdominal or pelvic lymph nodes. Reproductive: Uterus and bilateral adnexa are unremarkable. Other: No abdominal  wall hernia or abnormality. No abdominopelvic ascites. Musculoskeletal: No acute osseous abnormality. No aggressive osseous lesion. Bilateral facet arthropathy at L4-5 and L5-S1. IMPRESSION: 1. Cortical low attenuation areas in the left kidney with mild urothelial enhancement concerning for pyelonephritis. 7.5 mm hypodense rounded area in the upper pole of the left kidney which may reflect a small intrarenal abscess versus cyst. 2. Relative bladder wall thickening which may be secondary to underdistention versus cystitis. Electronically Signed   By: Kathreen Devoid M.D.   On: 08/12/2021 13:58  ? ?DG Chest Port 1 View ? ?Result Date: 08/13/2021 ?CLINICAL DATA:  Sepsis, fever EXAM: PORTABLE CHEST 1 VIEW COMPARISON:  None. FINDINGS: The heart size and mediastinal contours are within normal limits. Both lungs are clear. The visualized skeletal structures are unremarkable. IMPRESSION: No active disease. Electronically Signed   By: Fidela Salisbury M.D.   On: 08/13/2021 21:57   ? ?Procedures ?Procedures  ? ? ?Medications Ordered in ED ?Medications  ?cefTRIAXone (ROCEPHIN) 2 g in sodium chloride 0.9 % 100 mL IVPB (0 g  Intravenous Stopped 08/13/21 2152)  ?sodium chloride 0.9 % bolus 1,000 mL (0 mLs Intravenous Stopped 08/13/21 2238)  ?acetaminophen (TYLENOL) tablet 650 mg (650 mg Oral Given 08/13/21 2125)  ?fentaNYL (SUBLIMAZE) injectio

## 2021-08-14 ENCOUNTER — Observation Stay (HOSPITAL_COMMUNITY): Payer: BC Managed Care – PPO

## 2021-08-14 DIAGNOSIS — B9620 Unspecified Escherichia coli [E. coli] as the cause of diseases classified elsewhere: Secondary | ICD-10-CM | POA: Diagnosis present

## 2021-08-14 DIAGNOSIS — I10 Essential (primary) hypertension: Secondary | ICD-10-CM | POA: Diagnosis present

## 2021-08-14 DIAGNOSIS — H9190 Unspecified hearing loss, unspecified ear: Secondary | ICD-10-CM | POA: Diagnosis present

## 2021-08-14 DIAGNOSIS — R7303 Prediabetes: Secondary | ICD-10-CM | POA: Diagnosis present

## 2021-08-14 DIAGNOSIS — N151 Renal and perinephric abscess: Secondary | ICD-10-CM | POA: Diagnosis present

## 2021-08-14 DIAGNOSIS — R739 Hyperglycemia, unspecified: Secondary | ICD-10-CM

## 2021-08-14 DIAGNOSIS — D649 Anemia, unspecified: Secondary | ICD-10-CM | POA: Diagnosis not present

## 2021-08-14 DIAGNOSIS — Z885 Allergy status to narcotic agent status: Secondary | ICD-10-CM | POA: Diagnosis not present

## 2021-08-14 DIAGNOSIS — E876 Hypokalemia: Secondary | ICD-10-CM | POA: Diagnosis not present

## 2021-08-14 DIAGNOSIS — Z8249 Family history of ischemic heart disease and other diseases of the circulatory system: Secondary | ICD-10-CM | POA: Diagnosis not present

## 2021-08-14 DIAGNOSIS — D509 Iron deficiency anemia, unspecified: Secondary | ICD-10-CM | POA: Diagnosis present

## 2021-08-14 DIAGNOSIS — N12 Tubulo-interstitial nephritis, not specified as acute or chronic: Secondary | ICD-10-CM

## 2021-08-14 DIAGNOSIS — Z79899 Other long term (current) drug therapy: Secondary | ICD-10-CM | POA: Diagnosis not present

## 2021-08-14 DIAGNOSIS — R7989 Other specified abnormal findings of blood chemistry: Secondary | ICD-10-CM | POA: Diagnosis present

## 2021-08-14 LAB — FERRITIN: Ferritin: 180 ng/mL (ref 11–307)

## 2021-08-14 LAB — COMPREHENSIVE METABOLIC PANEL
ALT: 50 U/L — ABNORMAL HIGH (ref 0–44)
AST: 43 U/L — ABNORMAL HIGH (ref 15–41)
Albumin: 2.7 g/dL — ABNORMAL LOW (ref 3.5–5.0)
Alkaline Phosphatase: 87 U/L (ref 38–126)
Anion gap: 4 — ABNORMAL LOW (ref 5–15)
BUN: 7 mg/dL — ABNORMAL LOW (ref 8–23)
CO2: 22 mmol/L (ref 22–32)
Calcium: 8 mg/dL — ABNORMAL LOW (ref 8.9–10.3)
Chloride: 109 mmol/L (ref 98–111)
Creatinine, Ser: 0.48 mg/dL (ref 0.44–1.00)
GFR, Estimated: >60 mL/min (ref >60)
Glucose, Bld: 129 mg/dL — ABNORMAL HIGH (ref 70–99)
Potassium: 3 mmol/L — ABNORMAL LOW (ref 3.5–5.1)
Sodium: 135 mmol/L (ref 135–145)
Total Bilirubin: 0.6 mg/dL (ref 0.3–1.2)
Total Protein: 5.5 g/dL — ABNORMAL LOW (ref 6.5–8.1)

## 2021-08-14 LAB — CBC WITH DIFFERENTIAL/PLATELET
Abs Immature Granulocytes: 0.03 10*3/uL (ref 0.00–0.07)
Basophils Absolute: 0 10*3/uL (ref 0.0–0.1)
Basophils Relative: 0 %
Eosinophils Absolute: 0 10*3/uL (ref 0.0–0.5)
Eosinophils Relative: 0 %
HCT: 29 % — ABNORMAL LOW (ref 36.0–46.0)
Hemoglobin: 9.9 g/dL — ABNORMAL LOW (ref 12.0–15.0)
Immature Granulocytes: 1 %
Lymphocytes Relative: 17 %
Lymphs Abs: 1 10*3/uL (ref 0.7–4.0)
MCH: 32.5 pg (ref 26.0–34.0)
MCHC: 34.1 g/dL (ref 30.0–36.0)
MCV: 95.1 fL (ref 80.0–100.0)
Monocytes Absolute: 0.8 10*3/uL (ref 0.1–1.0)
Monocytes Relative: 14 %
Neutro Abs: 4.1 10*3/uL (ref 1.7–7.7)
Neutrophils Relative %: 68 %
Platelets: 165 10*3/uL (ref 150–400)
RBC: 3.05 MIL/uL — ABNORMAL LOW (ref 3.87–5.11)
RDW: 12.5 % (ref 11.5–15.5)
WBC: 6 10*3/uL (ref 4.0–10.5)
nRBC: 0 % (ref 0.0–0.2)

## 2021-08-14 LAB — IRON AND TIBC
Iron: 13 ug/dL — ABNORMAL LOW (ref 28–170)
Saturation Ratios: 5 % — ABNORMAL LOW (ref 10.4–31.8)
TIBC: 283 ug/dL (ref 250–450)
UIBC: 270 ug/dL

## 2021-08-14 LAB — URINE CULTURE: Culture: >=100000 — AB

## 2021-08-14 LAB — MAGNESIUM: Magnesium: 1.7 mg/dL (ref 1.7–2.4)

## 2021-08-14 LAB — HEMOGLOBIN A1C
Hgb A1c MFr Bld: 5.6 % (ref 4.8–5.6)
Mean Plasma Glucose: 114.02 mg/dL

## 2021-08-14 LAB — RETICULOCYTES
Immature Retic Fract: 14.7 % (ref 2.3–15.9)
RBC.: 3.27 MIL/uL — ABNORMAL LOW (ref 3.87–5.11)
Retic Count, Absolute: 49.4 10*3/uL (ref 19.0–186.0)
Retic Ct Pct: 1.5 % (ref 0.4–3.1)

## 2021-08-14 LAB — VITAMIN B12: Vitamin B-12: 333 pg/mL (ref 180–914)

## 2021-08-14 LAB — HIV ANTIBODY (ROUTINE TESTING W REFLEX): HIV Screen 4th Generation wRfx: NONREACTIVE

## 2021-08-14 LAB — FOLATE: Folate: 11.8 ng/mL (ref >5.9)

## 2021-08-14 MED ORDER — POTASSIUM CHLORIDE CRYS ER 20 MEQ PO TBCR
50.0000 meq | EXTENDED_RELEASE_TABLET | Freq: Once | ORAL | Status: AC
  Administered 2021-08-14: 50 meq via ORAL
  Filled 2021-08-14: qty 1

## 2021-08-14 MED ORDER — HEPARIN SODIUM (PORCINE) 5000 UNIT/ML IJ SOLN
5000.0000 [IU] | Freq: Three times a day (TID) | INTRAMUSCULAR | Status: DC
  Administered 2021-08-14 – 2021-08-15 (×4): 5000 [IU] via SUBCUTANEOUS
  Filled 2021-08-14 (×4): qty 1

## 2021-08-14 MED ORDER — HYDROCODONE-ACETAMINOPHEN 5-325 MG PO TABS
1.0000 | ORAL_TABLET | ORAL | Status: DC | PRN
  Administered 2021-08-14: 1 via ORAL
  Filled 2021-08-14: qty 1

## 2021-08-14 MED ORDER — DIAZEPAM 2 MG PO TABS: 2.0000 mg | ORAL_TABLET | Freq: Two times a day (BID) | ORAL | Status: DC | PRN

## 2021-08-14 MED ORDER — LOSARTAN POTASSIUM 50 MG PO TABS
50.0000 mg | ORAL_TABLET | Freq: Every day | ORAL | Status: DC
  Administered 2021-08-14 – 2021-08-15 (×2): 50 mg via ORAL
  Filled 2021-08-14 (×2): qty 1

## 2021-08-14 MED ORDER — FENTANYL CITRATE PF 50 MCG/ML IJ SOSY: 12.5000 ug | PREFILLED_SYRINGE | INTRAMUSCULAR | Status: DC | PRN

## 2021-08-14 MED ORDER — ALBUTEROL SULFATE HFA 108 (90 BASE) MCG/ACT IN AERS: 1.0000 | INHALATION_SPRAY | Freq: Four times a day (QID) | RESPIRATORY_TRACT | Status: DC | PRN

## 2021-08-14 MED ORDER — ACETAMINOPHEN 325 MG PO TABS
650.0000 mg | ORAL_TABLET | Freq: Four times a day (QID) | ORAL | Status: DC | PRN
  Administered 2021-08-14 – 2021-08-15 (×3): 650 mg via ORAL
  Filled 2021-08-14 (×3): qty 2

## 2021-08-14 MED ORDER — MAGNESIUM SULFATE 4 GM/100ML IV SOLN
4.0000 g | Freq: Once | INTRAVENOUS | Status: AC
  Administered 2021-08-14: 4 g via INTRAVENOUS
  Filled 2021-08-14: qty 100

## 2021-08-14 MED ORDER — SODIUM CHLORIDE 0.9 % IV SOLN: INTRAVENOUS | Status: DC

## 2021-08-14 MED ORDER — ONDANSETRON HCL 4 MG/2ML IJ SOLN: 4.0000 mg | Freq: Four times a day (QID) | INTRAMUSCULAR | Status: DC | PRN

## 2021-08-14 MED ORDER — HEPARIN SODIUM (PORCINE) 5000 UNIT/ML IJ SOLN: 5000.0000 [IU] | Freq: Three times a day (TID) | INTRAMUSCULAR | Status: DC

## 2021-08-14 MED ORDER — ACETAMINOPHEN 650 MG RE SUPP: 650.0000 mg | Freq: Four times a day (QID) | RECTAL | Status: DC | PRN

## 2021-08-14 MED ORDER — ONDANSETRON HCL 4 MG PO TABS: 4.0000 mg | ORAL_TABLET | Freq: Four times a day (QID) | ORAL | Status: DC | PRN

## 2021-08-14 NOTE — Assessment & Plan Note (Addendum)
--   suspect reactive, but will obtain A1c test to rule out prediabetes/diabetes/insulin resistance ?-- A1c 5.9 consistent with prediabetes mellitus, follow up with PCP  ?

## 2021-08-14 NOTE — Assessment & Plan Note (Addendum)
--   additional potassium supplement given today ?-- IV magnesium given 4/28 ?-- REPLETED  ?

## 2021-08-14 NOTE — Assessment & Plan Note (Addendum)
-  Continue losartan daily and follow up with PCP  ?

## 2021-08-14 NOTE — TOC Progression Note (Signed)
?  Transition of Care (TOC) Screening Note ? ? ?Patient Details  ?Name: Jamie Newman ?Date of Birth: [birth year] ? ? ?Transition of Care (TOC) CM/SW Contact:    ?Leitha Bleak, RN ?Phone Number: ?08/14/2021, 11:34 AM ? ? ? ?Transition of Care Department [facility]) has reviewed patient and no TOC needs have been identified at this time. We will continue to monitor patient advancement through interdisciplinary progression rounds. If new patient transition needs arise, please place a TOC consult. ? ? ?  ?Barriers to Discharge: Continued Medical Work up ? ?           ?  ?  ?  ?  ?  ?  ?  ?  ?  ?  ?

## 2021-08-14 NOTE — Assessment & Plan Note (Addendum)
--   Hg down from hemodilution from IV fluids ?-- anemia panel consistent with iron deficiency anemia  ?

## 2021-08-14 NOTE — Progress Notes (Signed)
Patients IV infiltrated and arm swelling, stopped fluids, patient then requested fluids be discontinued stating the she is intaking adequately by mouth. Notified MD Zierle-Ghosh who states ok to d/c fluids.  ?

## 2021-08-14 NOTE — Hospital Course (Addendum)
66 y.o. female with medical history significant of hearing loss and hypertension presents to the ED with a chief complaint of fever.  Patient reports she has felt feverish although she has not had a measured temperature.  Her Tmax at home was 99.8, but that was the first elevation since she started having flank pain.  This feverish feeling is the reason for her return to the ER.  The day prior she had already been to the ER for 5 days of left-sided flank pain.  It is a pressure, but then it throbs when she is micturating.  She has dysuria and suprapubic pain.  She had associated chills at that time 2.  She denies any hematuria.  Her urine has remained clear and no change of color.  She has had normal urine output which she reports she is measuring at home.  She was given IV Rocephin at her first visit to the ER and then sent home with Keflex.  She took 2 doses of Keflex at home before returning back to the ER.  She was started again on the Rocephin second ER visit.  Patient reports nausea and vomiting.  Her last normal meal was 2 days ago.  She has not had diarrhea or constipation.  Patient does report some dizziness and grogginess today.  She lives alone, ambulates without assistance.  She reports she was scared to stay home alone further because of this dizziness.  Patient reports that she is taken ibuprofen and Tylenol prior to her presentation to the ER today.  She has no other complaints at this time.  Patient does not smoke, does not drink, does not do illicit drugs.  She has had 4 COVID vaccines.  Patient is full code. ? ?08/15/2021:  Pt feeling better, tolerating diet, no emesis.  Pt stable to discharge home today.  ?

## 2021-08-14 NOTE — Assessment & Plan Note (Addendum)
--   mild bump in LFTs likely from acute infection  ?-- t bilirubin within normal limits  ?

## 2021-08-14 NOTE — Progress Notes (Addendum)
ASSUMPTION OF CARE NOTE  ? ?08/14/2021 ?1:01 PM ? ?Jamie Eddyancy Newman was seen and examined.  The H&P by the admitting provider, orders, imaging was reviewed.  Please see new orders.  Will continue to follow.  ? ?66 y.o. female with medical history significant of hearing loss and hypertension presents to the ED with a chief complaint of fever.  Patient reports she has felt feverish although she has not had a measured temperature.  Her Tmax at home was 99.8, but that was the first elevation since she started having flank pain.  This feverish feeling is the reason for her return to the ER.  The day prior she had already been to the ER for 5 days of left-sided flank pain.  It is a pressure, but then it throbs when she is micturating.  She has dysuria and suprapubic pain.  She had associated chills at that time 2.  She denies any hematuria.  Her urine has remained clear and no change of color.  She has had normal urine output which she reports she is measuring at home.  She was given IV Rocephin at her first visit to the ER and then sent home with Keflex.  She took 2 doses of Keflex at home before returning back to the ER.  She was started again on the Rocephin second ER visit.  Patient reports nausea and vomiting.  Her last normal meal was 2 days ago.  She has not had diarrhea or constipation.  Patient does report some dizziness and grogginess today.  She lives alone, ambulates without assistance.  She reports she was scared to stay home alone further because of this dizziness.  Patient reports that she is taken ibuprofen and Tylenol prior to her presentation to the ER today.  She has no other complaints at this time.  Patient does not smoke, does not drink, does not do illicit drugs.  She has had 4 COVID vaccines.  Patient is full code. ? ?Assessment and Plan: ?* Pyelonephritis ?- CT shows findings concerning for left pylenephritis ?-7.765mm hypodense finding could be abscess vs cyst --> renal ultrasound today to monitor  progress ?-rocephin started in ED on 4/26, and patient discharged home on Cephelexin - she took two doses before returning back to ED ?-Continue Rocephin ?-blood cultures no growth to date  ?-Urine culture grew E. coli greater than 100,000 units ?-Leukocytosis trending down from 12.7 on the 26th, 9.3 on the 27th, 6.0 today ?-Continue pain control ?-Continue IV hydration ?-Continue to monitor ?-renal US ordered to assess for possible abscess vs small cyst ? ?Hyperglycemia ?-- suspect reactive, but will obtain A1c test to rule out prediabetes/diabetes/insulin resistance ? ?Elevated LFTs ?-- mild bump in LFTs likely from acute infection  ?-- t bilirubin within normal limits  ? ?Hypokalemia ?-- additional potassium supplement given today ?-- IV magnesium given 4/28 ?-- recheck BMP, Mg in AM  ? ?Anemia, unspecified ?-- Hg down from hemodilution from IV fluids ?-- check anemia panel  ? ?HTN (hypertension) ?BP 171/89 in the ED ?-Continue losartan ? ? ?Vitals:  ? 08/14/21 0753 08/14/21 1257  ?BP: 119/69 136/84  ?Pulse: 71 76  ?Resp: 16 18  ?Temp: 98.3 ?F (36.8 ?C) 98 ?F (36.7 ?C)  ?SpO2: 100% 100%  ? ? ?Results for orders placed or performed during the hospital encounter of 08/13/21  ?Lactic acid, plasma  ?Result Value Ref Range  ? Lactic Acid, Venous 1.0 0.5 - 1.9 mmol/L  ?Lactic acid, plasma  ?Result Value Ref Range  ?  Lactic Acid, Venous 0.8 0.5 - 1.9 mmol/L  ?CBC with Differential  ?Result Value Ref Range  ? WBC 9.3 4.0 - 10.5 K/uL  ? RBC 3.71 (L) 3.87 - 5.11 MIL/uL  ? Hemoglobin 12.1 12.0 - 15.0 g/dL  ? HCT 35.1 (L) 36.0 - 46.0 %  ? MCV 94.6 80.0 - 100.0 fL  ? MCH 32.6 26.0 - 34.0 pg  ? MCHC 34.5 30.0 - 36.0 g/dL  ? RDW 12.4 11.5 - 15.5 %  ? Platelets 199 150 - 400 K/uL  ? nRBC 0.0 0.0 - 0.2 %  ? Neutrophils Relative % 82 %  ? Neutro Abs 7.7 1.7 - 7.7 K/uL  ? Lymphocytes Relative 8 %  ? Lymphs Abs 0.8 0.7 - 4.0 K/uL  ? Monocytes Relative 9 %  ? Monocytes Absolute 0.8 0.1 - 1.0 K/uL  ? Eosinophils Relative 0 %  ?  Eosinophils Absolute 0.0 0.0 - 0.5 K/uL  ? Basophils Relative 0 %  ? Basophils Absolute 0.0 0.0 - 0.1 K/uL  ? Immature Granulocytes 1 %  ? Abs Immature Granulocytes 0.05 0.00 - 0.07 K/uL  ?Basic metabolic panel  ?Result Value Ref Range  ? Sodium 133 (L) 135 - 145 mmol/L  ? Potassium 3.5 3.5 - 5.1 mmol/L  ? Chloride 101 98 - 111 mmol/L  ? CO2 23 22 - 32 mmol/L  ? Glucose, Bld 126 (H) 70 - 99 mg/dL  ? BUN 10 8 - 23 mg/dL  ? Creatinine, Ser 0.56 0.44 - 1.00 mg/dL  ? Calcium 8.8 (L) 8.9 - 10.3 mg/dL  ? GFR, Estimated >60 >60 mL/min  ? Anion gap 9 5 - 15  ?HIV Antibody (routine testing w rflx)  ?Result Value Ref Range  ? HIV Screen 4th Generation wRfx Non Reactive Non Reactive  ?Comprehensive metabolic panel  ?Result Value Ref Range  ? Sodium 135 135 - 145 mmol/L  ? Potassium 3.0 (L) 3.5 - 5.1 mmol/L  ? Chloride 109 98 - 111 mmol/L  ? CO2 22 22 - 32 mmol/L  ? Glucose, Bld 129 (H) 70 - 99 mg/dL  ? BUN 7 (L) 8 - 23 mg/dL  ? Creatinine, Ser 0.48 0.44 - 1.00 mg/dL  ? Calcium 8.0 (L) 8.9 - 10.3 mg/dL  ? Total Protein 5.5 (L) 6.5 - 8.1 g/dL  ? Albumin 2.7 (L) 3.5 - 5.0 g/dL  ? AST 43 (H) 15 - 41 U/L  ? ALT 50 (H) 0 - 44 U/L  ? Alkaline Phosphatase 87 38 - 126 U/L  ? Total Bilirubin 0.6 0.3 - 1.2 mg/dL  ? GFR, Estimated >60 >60 mL/min  ? Anion gap 4 (L) 5 - 15  ?Magnesium  ?Result Value Ref Range  ? Magnesium 1.7 1.7 - 2.4 mg/dL  ?CBC with Differential/Platelet  ?Result Value Ref Range  ? WBC 6.0 4.0 - 10.5 K/uL  ? RBC 3.05 (L) 3.87 - 5.11 MIL/uL  ? Hemoglobin 9.9 (L) 12.0 - 15.0 g/dL  ? HCT 29.0 (L) 36.0 - 46.0 %  ? MCV 95.1 80.0 - 100.0 fL  ? MCH 32.5 26.0 - 34.0 pg  ? MCHC 34.1 30.0 - 36.0 g/dL  ? RDW 12.5 11.5 - 15.5 %  ? Platelets 165 150 - 400 K/uL  ? nRBC 0.0 0.0 - 0.2 %  ? Neutrophils Relative % 68 %  ? Neutro Abs 4.1 1.7 - 7.7 K/uL  ? Lymphocytes Relative 17 %  ? Lymphs Abs 1.0 0.7 - 4.0 K/uL  ? Monocytes Relative  14 %  ? Monocytes Absolute 0.8 0.1 - 1.0 K/uL  ? Eosinophils Relative 0 %  ? Eosinophils Absolute 0.0 0.0  - 0.5 K/uL  ? Basophils Relative 0 %  ? Basophils Absolute 0.0 0.0 - 0.1 K/uL  ? Immature Granulocytes 1 %  ? Abs Immature Granulocytes 0.03 0.00 - 0.07 K/uL  ? ?Time spent 30 mins  ? ?C. Laural Benes, MD ?[facility] ? ? 08/13/2021  8:35 PM ?How to contact the [facility] Attending or Consulting provider 7A - 7P or covering provider during after hours 7P -7A, for this patient?  ?Check the care team in [facility] and look for a) attending/consulting [facility] provider listed and b) the [facility] team listed ?Log into [email or web address] and use [place]'s universal password to access. If you do not have the password, please contact the hospital operator. ?Locate the [facility] provider you are looking for under [facility] and page to a number that you can be directly reached. ?If you still have difficulty reaching the provider, please page the [facility] (Director on Call) for the Hospitalists listed on amion for assistance. ? ?

## 2021-08-14 NOTE — H&P (Signed)
?History and Physical  ? ? ?Patient: Jamie Newman KPT:[id number] DOB: [birth year] ?DOA: 08/13/2021 ?DOS: the patient was seen and examined on 08/14/2021 ?PCP: Benita Stabile, MD  ?Patient coming from: Home ? ?Chief Complaint:  ?Chief Complaint  ?Patient presents with  ? Fever  ? ?HPI: Jamie Newman is a 66 y.o. female with medical history significant of hearing loss and hypertension presents to the ED with a chief complaint of fever.  Patient reports she has felt feverish although she has not had a measured temperature.  Her Tmax at home was 99.8, but that was the first elevation since she started having flank pain.  This feverish feeling is the reason for her return to the ER.  The day prior she had already been to the ER for 5 days of left-sided flank pain.  It is a pressure, but then it throbs when she is micturating.  She has dysuria and suprapubic pain.  She had associated chills at that time 2.  She denies any hematuria.  Her urine has remained clear and no change of color.  She has had normal urine output which she reports she is measuring at home.  She was given IV Rocephin at her first visit to the ER and then sent home with Keflex.  She took 2 doses of Keflex at home before returning back to the ER.  She was started again on the Rocephin second ER visit.  Patient reports nausea and vomiting.  Her last normal meal was 2 days ago.  She has not had diarrhea or constipation.  Patient does report some dizziness and grogginess today.  She lives alone, ambulates without assistance.  She reports she was scared to stay home alone further because of this dizziness.  Patient reports that she is taken ibuprofen and Tylenol prior to her presentation to the ER today.  She has no other complaints at this time. ? ?Patient does not smoke, does not drink, does not do illicit drugs.  She has had 4 COVID vaccines.  Patient is full code. ?Review of Systems: As mentioned in the history of present illness. All other systems reviewed and  are negative. ?Past Medical History:  ?Diagnosis Date  ? Hearing loss   ? Hypertension   ? ?Past Surgical History:  ?Procedure Laterality Date  ? CESAREAN SECTION    ? ?Social History:  reports that she has never smoked. She has never used smokeless tobacco. She reports that she does not drink alcohol and does not use drugs. ? ?Allergies  ?Allergen Reactions  ? Morphine And Related Nausea And Vomiting  ? Codeine Nausea And Vomiting  ? ? ?Family History  ?Problem Relation Age of Onset  ? Healthy Mother   ? Hypertension Mother   ? Mental illness Mother   ? Cancer Father   ? Healthy Sister   ? Healthy Brother   ? ? ?Prior to Admission medications   ?Medication Sig Start Date End Date Taking? Authorizing Provider  ?albuterol (VENTOLIN HFA) 108 (90 Base) MCG/ACT inhaler 1-2 puffs qid prn wheezing 03/16/10   [provider]  ?cephALEXin (KEFLEX) 500 MG capsule Take 1 capsule (500 mg total) by mouth 3 (three) times daily for 14 days. 08/12/21 08/26/21  Alvira Monday, MD  ?Cholecalciferol (KP VITAMIN D) 25 MCG (1000 UT) capsule 2,000 Units daily.    [provider]  ?diazepam (VALIUM) 2 MG tablet Take 2 mg by mouth daily. 10/12/16   [provider]  ?diazepam (VALIUM) 2 MG tablet Take  2 mg by mouth daily. 02/27/19   [provider]  ?EPINEPHrine 0.3 mg/0.3 mL IJ SOAJ injection as directed 12/05/08   [provider]  ?HYDROcodone-acetaminophen (NORCO/VICODIN) 5-325 MG tablet Take 1 tablet by mouth every 4 (four) hours as needed. 08/12/21   Alvira MondaySchlossman, Erin, MD  ?L-Lysine 1000 MG TABS Take by mouth.    [provider]  ?losartan (COZAAR) 50 MG tablet Take 1 tablet (50 mg total) by mouth daily. 06/11/19   Wandra Feinsteinorum, Lisa L, MD  ?Multiple Vitamin (MULTIVITAMIN) capsule Take by mouth.    [provider]  ? ? ?Physical Exam: ?Vitals:  ? 08/13/21 2330 08/14/21 0000 08/14/21 0333 08/14/21 0335  ?BP: 106/65 99/64  104/80  ?Pulse: 85 74  77  ?Resp: 17 16  20   ?Temp:    97.9 ?F  (36.6 ?C)  ?TempSrc:    Oral  ?SpO2: 92% 95%  99%  ?Weight:   55.2 kg   ?Height:   5\' 1"  (1.549 m)   ? ?1.  General: ?Patient lying supine in bed,  no acute distress ?  ?2. Psychiatric: ?Alert and oriented x 3, mood and behavior normal for situation, pleasant and cooperative with exam ?  ?3. Neurologic: ?Speech and language are normal, face is symmetric, moves all 4 extremities voluntarily, at baseline without acute deficits on limited exam ?  ?4. HEENMT:  ?Head is atraumatic, normocephalic, pupils reactive to light, neck is supple, trachea is midline, mucous membranes are moist ?  ?5. Respiratory : ?Lungs are clear to auscultation bilaterally without wheezing, rhonchi, rales, no cyanosis, no increase in work of breathing or accessory muscle use ?  ?6. Cardiovascular : ?Heart rate normal, rhythm is regular, no murmurs, rubs or gallops, no peripheral edema, peripheral pulses palpated ?  ?7. Gastrointestinal:  ?Abdomen is soft, nondistended, tenderness to palpation of left flank, bowel sounds active, no masses or organomegaly palpated ?  ?8. Skin:  ?Skin is warm, dry and intact without rashes, acute lesions, or ulcers on limited exam ?  ?9.Musculoskeletal:  ?No acute deformities or trauma, no asymmetry in tone, no peripheral edema, peripheral pulses palpated, no tenderness to palpation in the extremities ? ?Data Reviewed: ?In the ED ?Temp 98.3, heart rate 97-103, respiratory rate 15-17, blood pressure 137/78-171/89, satting at 97% ?Leukocytosis has improved from 12 the day prior to 9.3 today ?Creatinine has remained the same 0.56x2 ?Lactic acid has been normal 1.0-0.8 ?urine culture grew E. coli ?Patient chest x-ray showed no active disease ?EKG shows sinus rhythm heart rate 88, QTc 441 ?She was given Tylenol, Rocephin, fentanyl, Zofran, 1 L normal saline in the ED ?Admission requested for management of pyelonephritis ? ?Assessment and Plan: ?* Pyelonephritis ?- CT shows findings concerning for left  pylenephritis ?-7.575mm hypodense finding could be abscess vs cyst --> renal ultrasound today to monitor progress ?-rocephin started in ED on 4/26, and patient discharged home on Cephelexin - she took two doses before returning back to ED ?-Continue Rocephin ?-blood cultures pending ?-Urine culture grew E. coli greater than 100,000 units ?-Leukocytosis trending down from 12.7 on the 26th, 9.3 on the 27th, 6.0 today ?-Continue pain control ?-Continue IV hydration ?-Continue to monitor ? ?HTN (hypertension) ?BP 171/89 in the ED ?-Continue losartan ? ? ? ? ? Advance Care Planning:   Code Status: Full Code  ? ?Consults: Urology has been consulted from the first ER visit and reported  that it was safe to discharge patient home and that the fluid collection seen on CT  was likely cyst not abscess. ?Family Communication: No family at bedside ? ?Severity of Illness: ?The appropriate patient status for this patient is OBSERVATION. Observation status is judged to be reasonable and necessary in order to provide the required intensity of service to ensure the patient's safety. The patient's presenting symptoms, physical exam findings, and initial radiographic and laboratory data in the context of their medical condition is felt to place them at decreased risk for further clinical deterioration. Furthermore, it is anticipated that the patient will be medically stable for discharge from the hospital within 2 midnights of admission.  ? ?Author: ?Lilyan Gilford, DO ?08/14/2021 5:50 AM ? ?For on call review [email or web address].  ?

## 2021-08-14 NOTE — Progress Notes (Addendum)
Has not asked for anymore pain medication since given tylenol this morning.  Still has some headache pain.  Showered today. Temp currently 100 orally, given tylenol ?

## 2021-08-14 NOTE — Assessment & Plan Note (Addendum)
-   CT shows findings concerning for left pylenephritis ?-7.46mm hypodense finding could be abscess vs cyst --> renal ultrasound today to monitor progress ?-rocephin started in ED on 4/26, and patient discharged home on Cephelexin - she took two doses before returning back to ED ?-Continue Rocephin ?-blood cultures no growth to date  ?-Urine culture grew E. coli greater than 100,000 units ?-Leukocytosis trending down from 12.7 on the 26th, 9.3 on the 27th, 7.0 today ?-adequate pain control ?-treated with IV hydration ?-Pt feeling better, tolerating diet, stable to discharge home today.  ?-renal US ordered to assess for possible abscess vs small cyst:  1.1 cm lesion found but unable to clarify if this is abscess vs cyst.  My recommendation is for patient to complete full 14 day course of antibiotics and then have renal US repeated outpatient with her PCP to see if it has completely resolved by then.   ?

## 2021-08-15 ENCOUNTER — Telehealth (HOSPITAL_BASED_OUTPATIENT_CLINIC_OR_DEPARTMENT_OTHER): Payer: Self-pay | Admitting: *Deleted

## 2021-08-15 DIAGNOSIS — E611 Iron deficiency: Secondary | ICD-10-CM

## 2021-08-15 LAB — BASIC METABOLIC PANEL
Anion gap: 5 (ref 5–15)
BUN: 8 mg/dL (ref 8–23)
CO2: 24 mmol/L (ref 22–32)
Calcium: 8.4 mg/dL — ABNORMAL LOW (ref 8.9–10.3)
Chloride: 110 mmol/L (ref 98–111)
Creatinine, Ser: 0.45 mg/dL (ref 0.44–1.00)
GFR, Estimated: >60 mL/min (ref >60)
Glucose, Bld: 91 mg/dL (ref 70–99)
Potassium: 3.9 mmol/L (ref 3.5–5.1)
Sodium: 139 mmol/L (ref 135–145)

## 2021-08-15 LAB — CBC
HCT: 30.6 % — ABNORMAL LOW (ref 36.0–46.0)
Hemoglobin: 10.1 g/dL — ABNORMAL LOW (ref 12.0–15.0)
MCH: 31.3 pg (ref 26.0–34.0)
MCHC: 33 g/dL (ref 30.0–36.0)
MCV: 94.7 fL (ref 80.0–100.0)
Platelets: 196 10*3/uL (ref 150–400)
RBC: 3.23 MIL/uL — ABNORMAL LOW (ref 3.87–5.11)
RDW: 12.3 % (ref 11.5–15.5)
WBC: 7 10*3/uL (ref 4.0–10.5)
nRBC: 0 % (ref 0.0–0.2)

## 2021-08-15 LAB — MAGNESIUM: Magnesium: 1.9 mg/dL (ref 1.7–2.4)

## 2021-08-15 MED ORDER — CEFDINIR 300 MG PO CAPS: 300.0000 mg | ORAL_CAPSULE | Freq: Two times a day (BID) | ORAL | 0 refills | Status: AC

## 2021-08-15 MED ORDER — VITAMIN C 500 MG PO CAPS: 1.0000 | ORAL_CAPSULE | Freq: Every day | ORAL | 2 refills | Status: AC

## 2021-08-15 MED ORDER — IRON (FERROUS SULFATE) 325 (65 FE) MG PO TABS: 1.0000 | ORAL_TABLET | Freq: Every day | ORAL | 2 refills | Status: AC

## 2021-08-15 MED ORDER — HYDROCODONE-ACETAMINOPHEN 5-325 MG PO TABS: 1.0000 | ORAL_TABLET | ORAL | Status: AC | PRN

## 2021-08-15 MED ORDER — FLUCONAZOLE 150 MG PO TABS: 150.0000 mg | ORAL_TABLET | Freq: Once | ORAL | 0 refills | Status: AC

## 2021-08-15 NOTE — Progress Notes (Signed)
Discharge instructions given. Pt verbalized understanding. Awaiting ride to arrive.  ?

## 2021-08-15 NOTE — Discharge Instructions (Signed)
PLEASE ARRANGE FOR A REPEAT RENAL ULTRASOUND IN 2 WEEKS AFTER YOU HAVE COMPLETED ANTIBIOTICS WITH YOUR PCP.  ? ? ?IMPORTANT INFORMATION: PAY CLOSE ATTENTION  ? ?PHYSICIAN DISCHARGE INSTRUCTIONS ? ?Follow with Primary care provider  Benita Stabile, MD  and other consultants as instructed by your Hospitalist Physician ? ?SEEK MEDICAL CARE OR RETURN TO EMERGENCY ROOM IF SYMPTOMS COME BACK, WORSEN OR NEW PROBLEM DEVELOPS  ? ?Please note: ?You were cared for by a hospitalist during your hospital stay. Every effort will be made to forward records to your primary care provider.  You can request that your primary care provider send for your hospital records if they have not received them.  Once you are discharged, your primary care physician will handle any further medical issues. Please note that NO REFILLS for any discharge medications will be authorized once you are discharged, as it is imperative that you return to your primary care physician (or establish a relationship with a primary care physician if you do not have one) for your post hospital discharge needs so that they can reassess your need for medications and monitor your lab values. ? ?Please get a complete blood count and chemistry panel checked by your Primary MD at your next visit, and again as instructed by your Primary MD. ? ?Get Medicines reviewed and adjusted: ?Please take all your medications with you for your next visit with your Primary MD ? ?Laboratory/radiological data: ?Please request your Primary MD to go over all hospital tests and procedure/radiological results at the follow up, please ask your primary care provider to get all Hospital records sent to his/her office. ? ?In some cases, they will be blood work, cultures and biopsy results pending at the time of your discharge. Please request that your primary care provider follow up on these results. ? ?If you are diabetic, please bring your blood sugar readings with you to your follow up  appointment with primary care.   ? ?Please call and make your follow up appointments as soon as possible.   ? ?Also Note the following: ?If you experience worsening of your admission symptoms, develop shortness of breath, life threatening emergency, suicidal or homicidal thoughts you must seek medical attention immediately by calling 911 or calling your MD immediately  if symptoms less severe. ? ?You must read complete instructions/literature along with all the possible adverse reactions/side effects for all the Medicines you take and that have been prescribed to you. Take any new Medicines after you have completely understood and accpet all the possible adverse reactions/side effects.  ? ?Do not drive when taking Pain medications or sleeping medications (Benzodiazepines) ? ?Do not take more than prescribed Pain, Sleep and Anxiety Medications. It is not advisable to combine anxiety,sleep and pain medications without talking with your primary care practitioner ? ?Special Instructions: If you have smoked or chewed Tobacco  in the last 2 yrs please stop smoking, stop any regular Alcohol  and or any Recreational drug use. ? ?Wear Seat belts while driving.  Do not drive if taking any narcotic, mind altering or controlled substances or recreational drugs or alcohol.  ? ? ? ? ? ? ? ?

## 2021-08-15 NOTE — Assessment & Plan Note (Addendum)
--   starting daily iron supplement with vitamin C to increase iron absorption  ?-- Pt reports that she has had a colonoscopy.  ?-- my recommendation is for her to follow up with PCP to have this monitored and further work up if necessary  ?

## 2021-08-15 NOTE — Telephone Encounter (Signed)
Post ED Visit - Positive Culture Follow-up ? ?Culture report reviewed by antimicrobial stewardship pharmacist: ?[facility] ?[]  , Enzo Bi.D. ?[]  [address], Pharm.D., BCPS AQ-ID ?[]  , Pharm.D., BCPS ?[]  Celedonio Miyamoto, Pharm.D., BCPS ?[]  [place], Garvin Fila.D., BCPS, AAHIVP ?[]  , Pharm.D., BCPS, AAHIVP ?[]  Georgina Pillion, PharmD, BCPS ?[]  , PharmD, BCPS ?[]  [place], PharmD, BCPS ?[]  [address], PharmD ?[]  , PharmD, BCPS ?[x]  Estella Husk, PharmD ? ? [facility] ?[]  Lysle Pearl, PharmD ?[]  , PharmD ?[]  Phillips Climes, PharmD ?[]  , Rph ?[]  Agapito Games) , PharmD ?[]  Verlan Friends, PharmD ?[]  , PharmD ?[]  Mervyn Gay, PharmD ?[]  , PharmD ?[]  Vinnie Level, PharmD ?[]  Gerri Spore, PharmD ?[]  , PharmD ?[]  Len Childs, PharmD ? ? ?Positive urine culture ?Treated with Cephalexin, organism sensitive to the same and no further patient follow-up is required at this time. ? ? ?08/15/2021, 12:05 PM ?  ?

## 2021-08-15 NOTE — Discharge Summary (Signed)
Physician Discharge Summary  ?Jamie Newman [id number] DOB: 1956-01-31 DOA: 08/13/2021 ? ?PCP: Celene Squibb, MD ? ?Admit date: 08/13/2021 ?Discharge date: 08/15/2021 ? ?Admitted From:  Home  ?Disposition: Home  ? ?Recommendations for Outpatient Follow-up:  ?Follow up with PCP in 2 weeks to have repeat labs and to order renal ultrasound ?Please repeat renal US after completing full course of antibiotics ?Please monitor iron deficiency anemia, may need repeat colonoscopy done ? ?Discharge Condition: STABLE   ?CODE STATUS: FULL  ?DIET: iron rich foods and vitamin C ? ?Brief Hospitalization Summary: ?Please see all hospital notes, images, labs for full details of the hospitalization. ?66 y.o. female with medical history significant of hearing loss and hypertension presents to the ED with a chief complaint of fever.  Patient reports she has felt feverish although she has not had a measured temperature.  Her Tmax at home was 99.8, but that was the first elevation since she started having flank pain.  This feverish feeling is the reason for her return to the ER.  The day prior she had already been to the ER for 5 days of left-sided flank pain.  It is a pressure, but then it throbs when she is micturating.  She has dysuria and suprapubic pain.  She had associated chills at that time 2.  She denies any hematuria.  Her urine has remained clear and no change of color.  She has had normal urine output which she reports she is measuring at home.  She was given IV Rocephin at her first visit to the ER and then sent home with Keflex.  She took 2 doses of Keflex at home before returning back to the ER.  She was started again on the Rocephin second ER visit.  Patient reports nausea and vomiting.  Her last normal meal was 2 days ago.  She has not had diarrhea or constipation.  Patient does report some dizziness and grogginess today.  She lives alone, ambulates without assistance.  She reports she was scared to stay home alone further  because of this dizziness.  Patient reports that she is taken ibuprofen and Tylenol prior to her presentation to the ER today.  She has no other complaints at this time.  Patient does not smoke, does not drink, does not do illicit drugs.  She has had 4 COVID vaccines.  Patient is full code. ? ?08/15/2021:  Pt feeling better, tolerating diet, no emesis.  Pt stable to discharge home today.  ? ?HOSPITAL COURSE BY PROBLEM LIST  ? ?Assessment and Plan: ?* Pyelonephritis ?- CT shows findings concerning for left pylenephritis ?-7.10mm hypodense finding could be abscess vs cyst --> renal ultrasound today to monitor progress ?-rocephin started in ED on 4/26, and patient discharged home on Cephelexin - she took two doses before returning back to ED ?-Continue Rocephin ?-blood cultures no growth to date  ?-Urine culture grew E. coli greater than 100,000 units ?-Leukocytosis trending down from 12.7 on the 26th, 9.3 on the 27th, 7.0 today ?-adequate pain control ?-treated with IV hydration ?-Pt feeling better, tolerating diet, stable to discharge home today.  ?-renal US ordered to assess for possible abscess vs small cyst:  1.1 cm lesion found but unable to clarify if this is abscess vs cyst.  My recommendation is for patient to complete full 14 day course of antibiotics and then have renal US repeated outpatient with her PCP to see if it has completely resolved by then.   ? ?Iron deficiency ?-- starting  daily iron supplement with vitamin C to increase iron absorption  ?-- Pt reports that she has had a colonoscopy.  ?-- my recommendation is for her to follow up with PCP to have this monitored and further work up if necessary  ? ?Hyperglycemia/Prediabetes mellitus ?-- suspect reactive, but will obtain A1c test to rule out prediabetes/diabetes/insulin resistance ?-- A1c 5.9 consistent with prediabetes mellitus, follow up with PCP  ? ?Elevated LFTs ?-- mild bump in LFTs likely from acute infection  ?-- t bilirubin within normal  limits  ? ?Hypokalemia ?-- additional potassium supplement given today ?-- IV magnesium given 4/28 ?-- REPLETED  ? ?Anemia, unspecified ?-- Hg down from hemodilution from IV fluids ?-- anemia panel consistent with iron deficiency anemia  ? ?HTN (hypertension) ?-Continue losartan daily and follow up with PCP  ? ?Discharge Diagnoses:  ?Principal Problem: ?  Pyelonephritis ?Active Problems: ?  HTN (hypertension) ?  Anemia, unspecified ?  Hypokalemia ?  Elevated LFTs ?  Hyperglycemia/Prediabetes mellitus ?  Iron deficiency ? ?Discharge Instructions: ? ?Allergies as of 08/15/2021   ? ?   Reactions  ? Morphine And Related Nausea And Vomiting  ? Codeine Nausea And Vomiting  ? ?  ? ?  ?Medication List  ?  ? ?STOP taking these medications   ? ?cephALEXin 500 MG capsule ?Commonly known as: KEFLEX ?  ? ?  ? ?TAKE these medications   ? ?albuterol 108 (90 Base) MCG/ACT inhaler ?Commonly known as: VENTOLIN HFA ?Inhale 1-2 puffs into the lungs every 6 (six) hours as needed for wheezing or shortness of breath. ?  ?cefdinir 300 MG capsule ?Commonly known as: OMNICEF ?Take 1 capsule (300 mg total) by mouth 2 (two) times daily for 11 days. ?Start taking on: August 16, 2021 ?  ?diazepam 2 MG tablet ?Commonly known as: VALIUM ?Take 2 mg by mouth daily as needed for anxiety. ?  ?EPINEPHrine 0.3 mg/0.3 mL Soaj injection ?Commonly known as: EPI-PEN ?as directed ?  ?fluconazole 150 MG tablet ?Commonly known as: DIFLUCAN ?Take 1 tablet (150 mg total) by mouth once for 1 dose. Repeat if needed ?  ?HYDROcodone-acetaminophen 5-325 MG tablet ?Commonly known as: NORCO/VICODIN ?Take 1 tablet by mouth every 4 (four) hours as needed for moderate pain. ?  ?Iron (Ferrous Sulfate) 325 (65 Fe) MG Tabs ?Take 1 tablet by mouth daily with breakfast. ?  ?losartan 50 MG tablet ?Commonly known as: COZAAR ?Take 1 tablet (50 mg total) by mouth daily. ?  ?multivitamin capsule ?Take 1 capsule by mouth daily. ?  ?Vitamin C 500 MG Caps ?Take 1 capsule by mouth daily  with breakfast. ?  ? ?  ? ? Follow-up Information   ? ? Celene Squibb, MD. Schedule an appointment as soon as possible for a visit in 2 week(s).   ?Specialty: Internal Medicine ?Why: Hospital Follow Up ?Contact information: ?[address] ?[address] ?[place] [address] ?[phone] ? ? ?  ?  ? ?  ?  ? ?  ? ?Allergies  ?Allergen Reactions  ? Morphine And Related Nausea And Vomiting  ? Codeine Nausea And Vomiting  ? ?Allergies as of 08/15/2021   ? ?   Reactions  ? Morphine And Related Nausea And Vomiting  ? Codeine Nausea And Vomiting  ? ?  ? ?  ?Medication List  ?  ? ?STOP taking these medications   ? ?cephALEXin 500 MG capsule ?Commonly known as: KEFLEX ?  ? ?  ? ?TAKE these medications   ? ?albuterol  108 (90 Base) MCG/ACT inhaler ?Commonly known as: VENTOLIN HFA ?Inhale 1-2 puffs into the lungs every 6 (six) hours as needed for wheezing or shortness of breath. ?  ?cefdinir 300 MG capsule ?Commonly known as: OMNICEF ?Take 1 capsule (300 mg total) by mouth 2 (two) times daily for 11 days. ?Start taking on: August 16, 2021 ?  ?diazepam 2 MG tablet ?Commonly known as: VALIUM ?Take 2 mg by mouth daily as needed for anxiety. ?  ?EPINEPHrine 0.3 mg/0.3 mL Soaj injection ?Commonly known as: EPI-PEN ?as directed ?  ?fluconazole 150 MG tablet ?Commonly known as: DIFLUCAN ?Take 1 tablet (150 mg total) by mouth once for 1 dose. Repeat if needed ?  ?HYDROcodone-acetaminophen 5-325 MG tablet ?Commonly known as: NORCO/VICODIN ?Take 1 tablet by mouth every 4 (four) hours as needed for moderate pain. ?  ?Iron (Ferrous Sulfate) 325 (65 Fe) MG Tabs ?Take 1 tablet by mouth daily with breakfast. ?  ?losartan 50 MG tablet ?Commonly known as: COZAAR ?Take 1 tablet (50 mg total) by mouth daily. ?  ?multivitamin capsule ?Take 1 capsule by mouth daily. ?  ?Vitamin C 500 MG Caps ?Take 1 capsule by mouth daily with breakfast. ?  ? ?  ? ?Procedures/Studies: ?CT ABDOMEN PELVIS W CONTRAST ? ?Result Date: 08/12/2021 ?CLINICAL DATA:  Abdominal pain,  bilateral flank pain for 4 days EXAM: CT ABDOMEN AND PELVIS WITH CONTRAST TECHNIQUE: Multidetector CT imaging of the abdomen and pelvis was performed using the standard protocol following bolus administration o

## 2021-08-17 LAB — CULTURE, BLOOD (ROUTINE X 2)
Culture: NO GROWTH
Culture: NO GROWTH
Special Requests: ADEQUATE
Special Requests: ADEQUATE

## 2021-08-26 ENCOUNTER — Other Ambulatory Visit: Payer: Self-pay | Admitting: Family Medicine

## 2021-08-26 ENCOUNTER — Other Ambulatory Visit (HOSPITAL_COMMUNITY): Payer: Self-pay | Admitting: Family Medicine

## 2021-08-26 DIAGNOSIS — N2889 Other specified disorders of kidney and ureter: Secondary | ICD-10-CM

## 2021-09-16 ENCOUNTER — Ambulatory Visit (HOSPITAL_COMMUNITY)
Admission: RE | Admit: 2021-09-16 | Discharge: 2021-09-16 | Disposition: A | Discharge status: Home / Self Care | Payer: BC Managed Care – PPO | Source: Ambulatory Visit | Attending: Family Medicine | Admitting: Family Medicine

## 2021-09-16 DIAGNOSIS — N2889 Other specified disorders of kidney and ureter: Secondary | ICD-10-CM | POA: Diagnosis present

## 2021-12-24 ENCOUNTER — Other Ambulatory Visit (HOSPITAL_COMMUNITY): Payer: Self-pay | Admitting: Family Medicine

## 2021-12-24 DIAGNOSIS — Z1231 Encounter for screening mammogram for malignant neoplasm of breast: Secondary | ICD-10-CM

## 2022-01-04 ENCOUNTER — Ambulatory Visit (HOSPITAL_COMMUNITY): Payer: BC Managed Care – PPO

## 2022-01-11 ENCOUNTER — Ambulatory Visit (HOSPITAL_COMMUNITY)
Admission: RE | Admit: 2022-01-11 | Discharge: 2022-01-11 | Disposition: A | Discharge status: Home / Self Care | Payer: BC Managed Care – PPO | Source: Ambulatory Visit | Attending: Family Medicine | Admitting: Family Medicine

## 2022-01-11 DIAGNOSIS — Z1231 Encounter for screening mammogram for malignant neoplasm of breast: Secondary | ICD-10-CM | POA: Diagnosis not present

## 2022-02-24 IMAGING — MG MM DIGITAL SCREENING BILAT W/ TOMO AND CAD
6 of 10 series · 6 of 30 positions shown · non-contrast
Comparison: Previous exam(s).

CLINICAL DATA: Screening.

EXAM:
DIGITAL SCREENING BILATERAL MAMMOGRAM WITH TOMOSYNTHESIS AND CAD
TECHNIQUE: Bilateral screening digital craniocaudal and mediolateral oblique
mammograms were obtained. Bilateral screening digital breast
tomosynthesis was performed. The images were evaluated with
computer-aided detection.

[R MLO synth-2D]
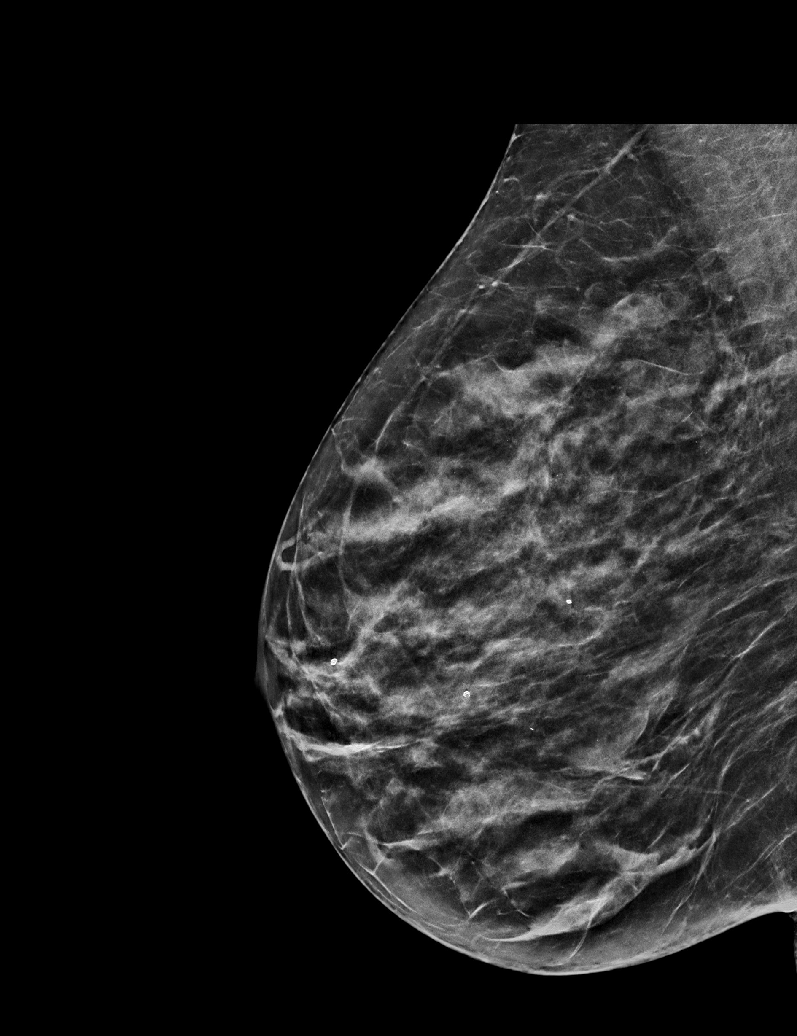

[L CC synth-2D]
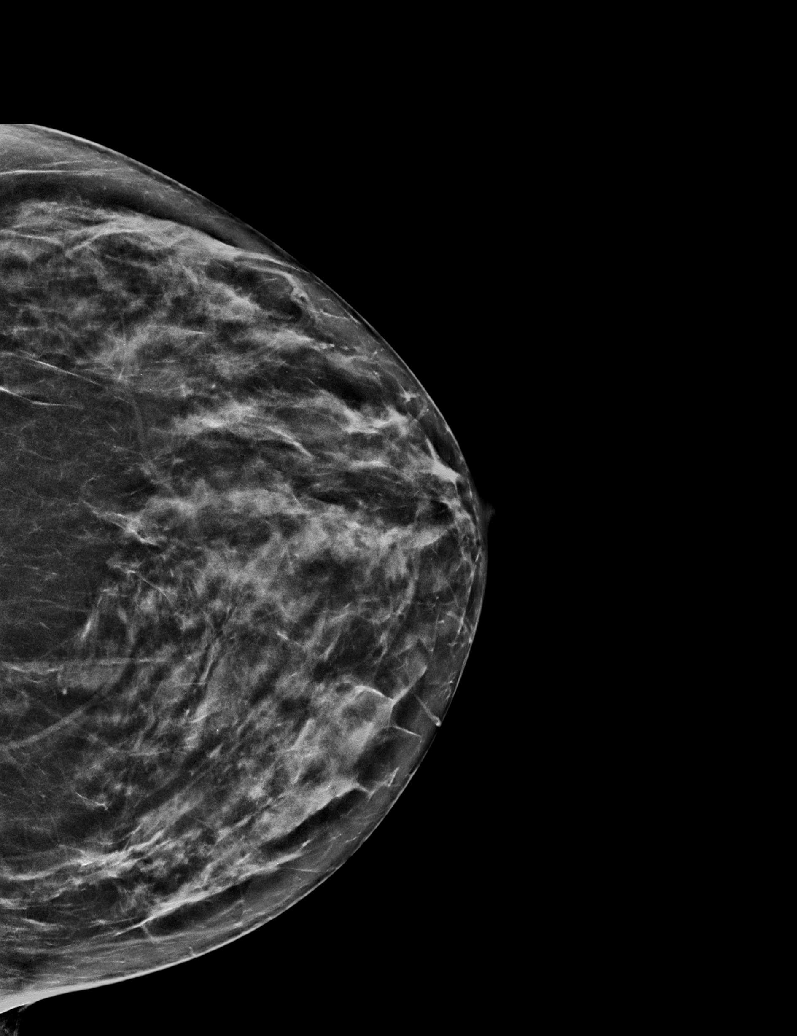

[L MLO synth-2D (1 of 2)]
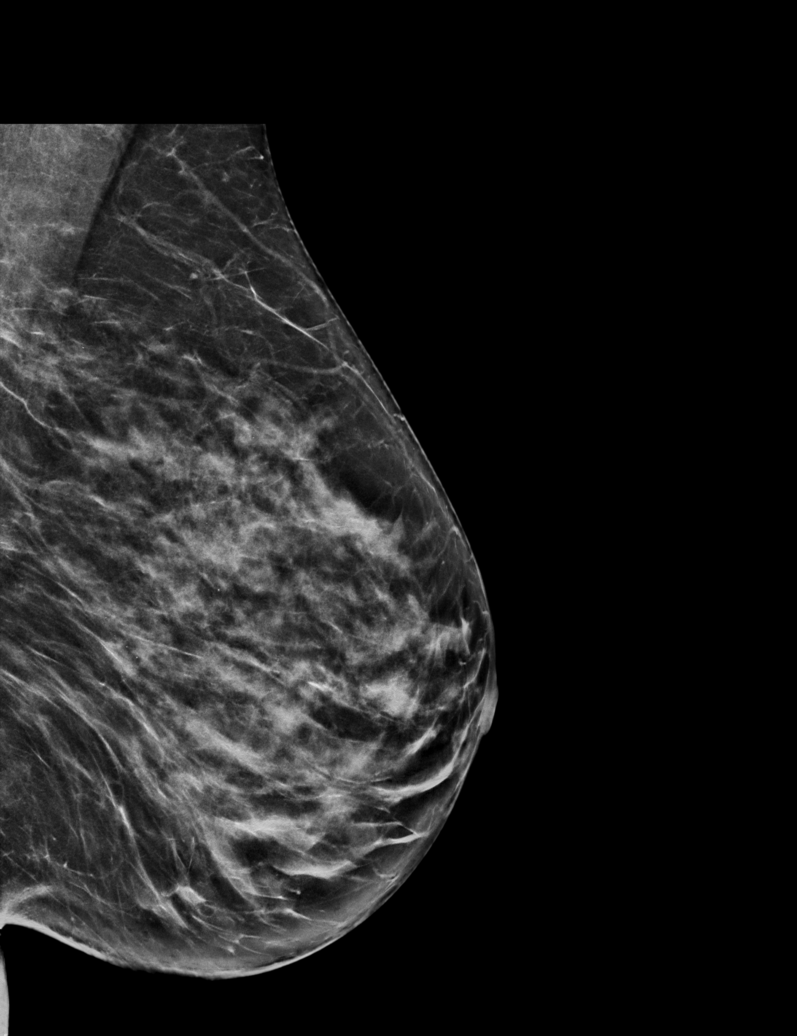

[L MLO synth-2D (2 of 2)]
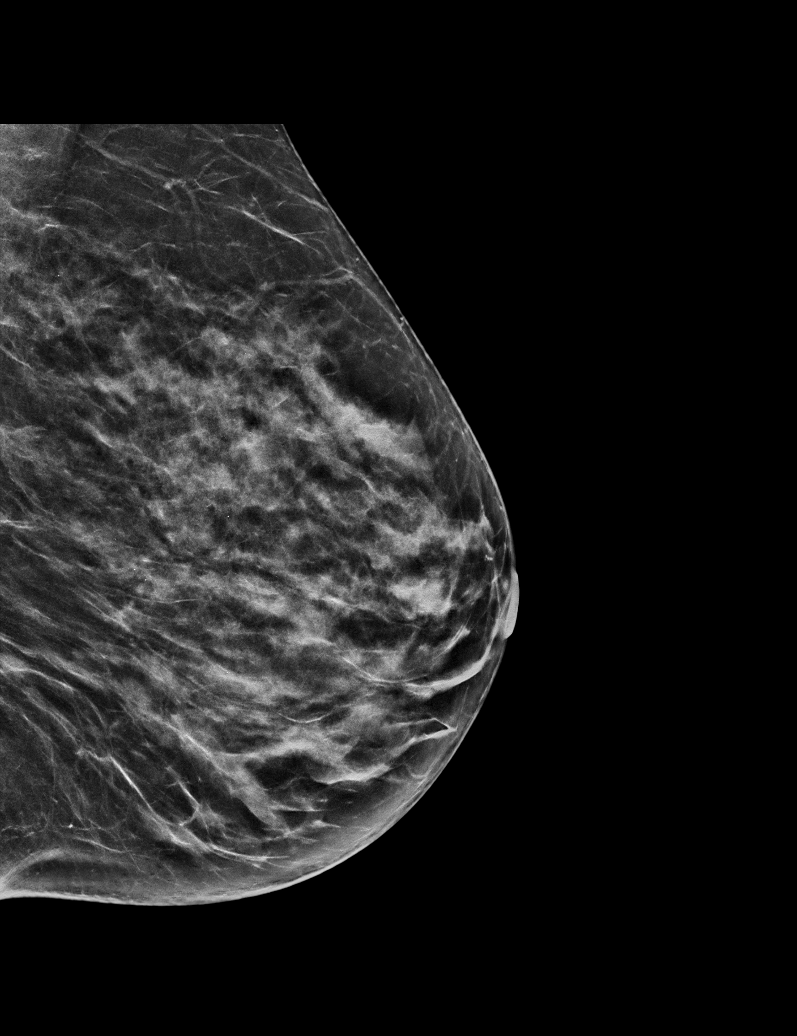

[R CC synth-2D]
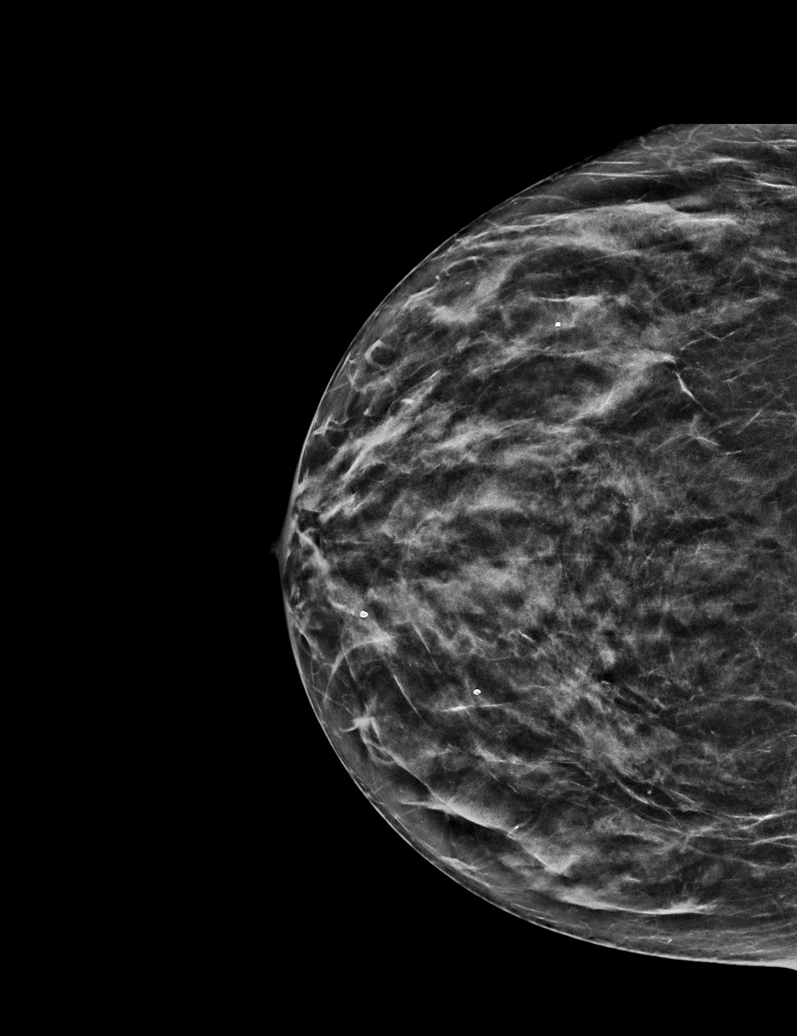

[L MLO tomo · tomo slice 29/58.0]
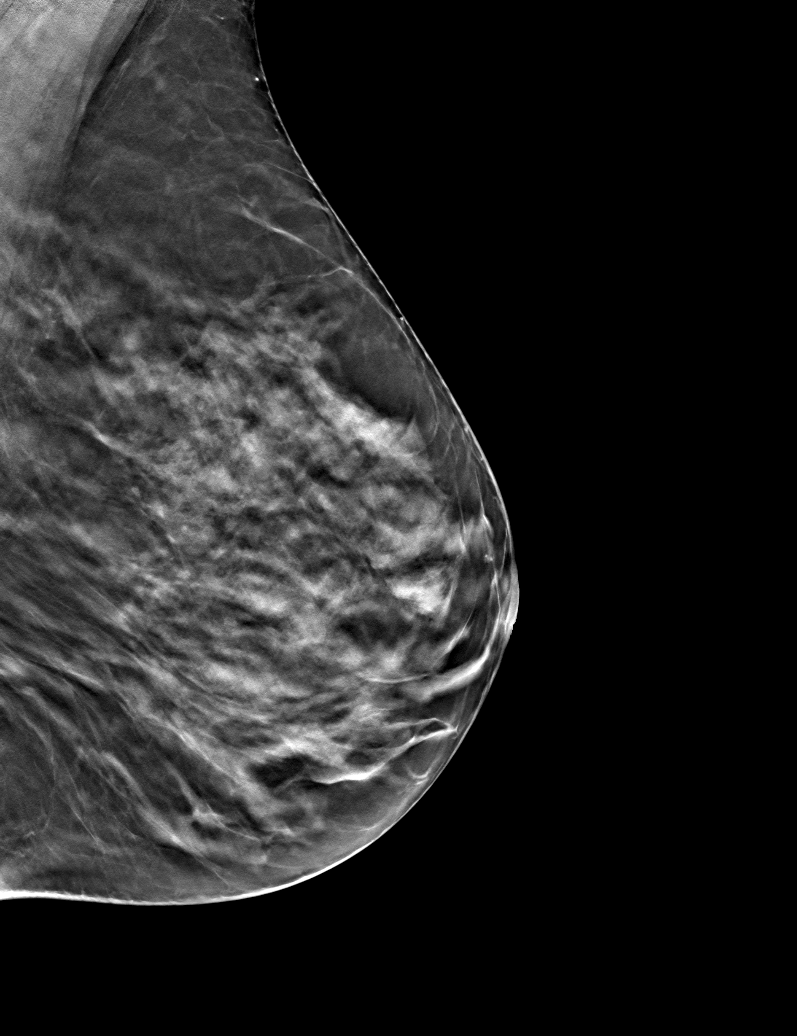

[6 of 30 positions shown; findings below may reference images not displayed]

ACR Breast Density Category c: The breast tissue is heterogeneously
dense, which may obscure small masses.
FINDINGS: There are no findings suspicious for malignancy.
IMPRESSION: No mammographic evidence of malignancy. A result letter of this
screening mammogram will be mailed directly to the patient.

RECOMMENDATION:
Screening mammogram in one year. (Code:Q3-W-BC3)

BI-RADS CATEGORY  1: Negative.

## 2022-12-07 IMAGING — DX DG CHEST 1V PORT
1 series · 1 of 1 positions shown · non-contrast
Comparison: None.

CLINICAL DATA: Sepsis, fever

EXAM:
PORTABLE CHEST 1 VIEW

[chest ap]
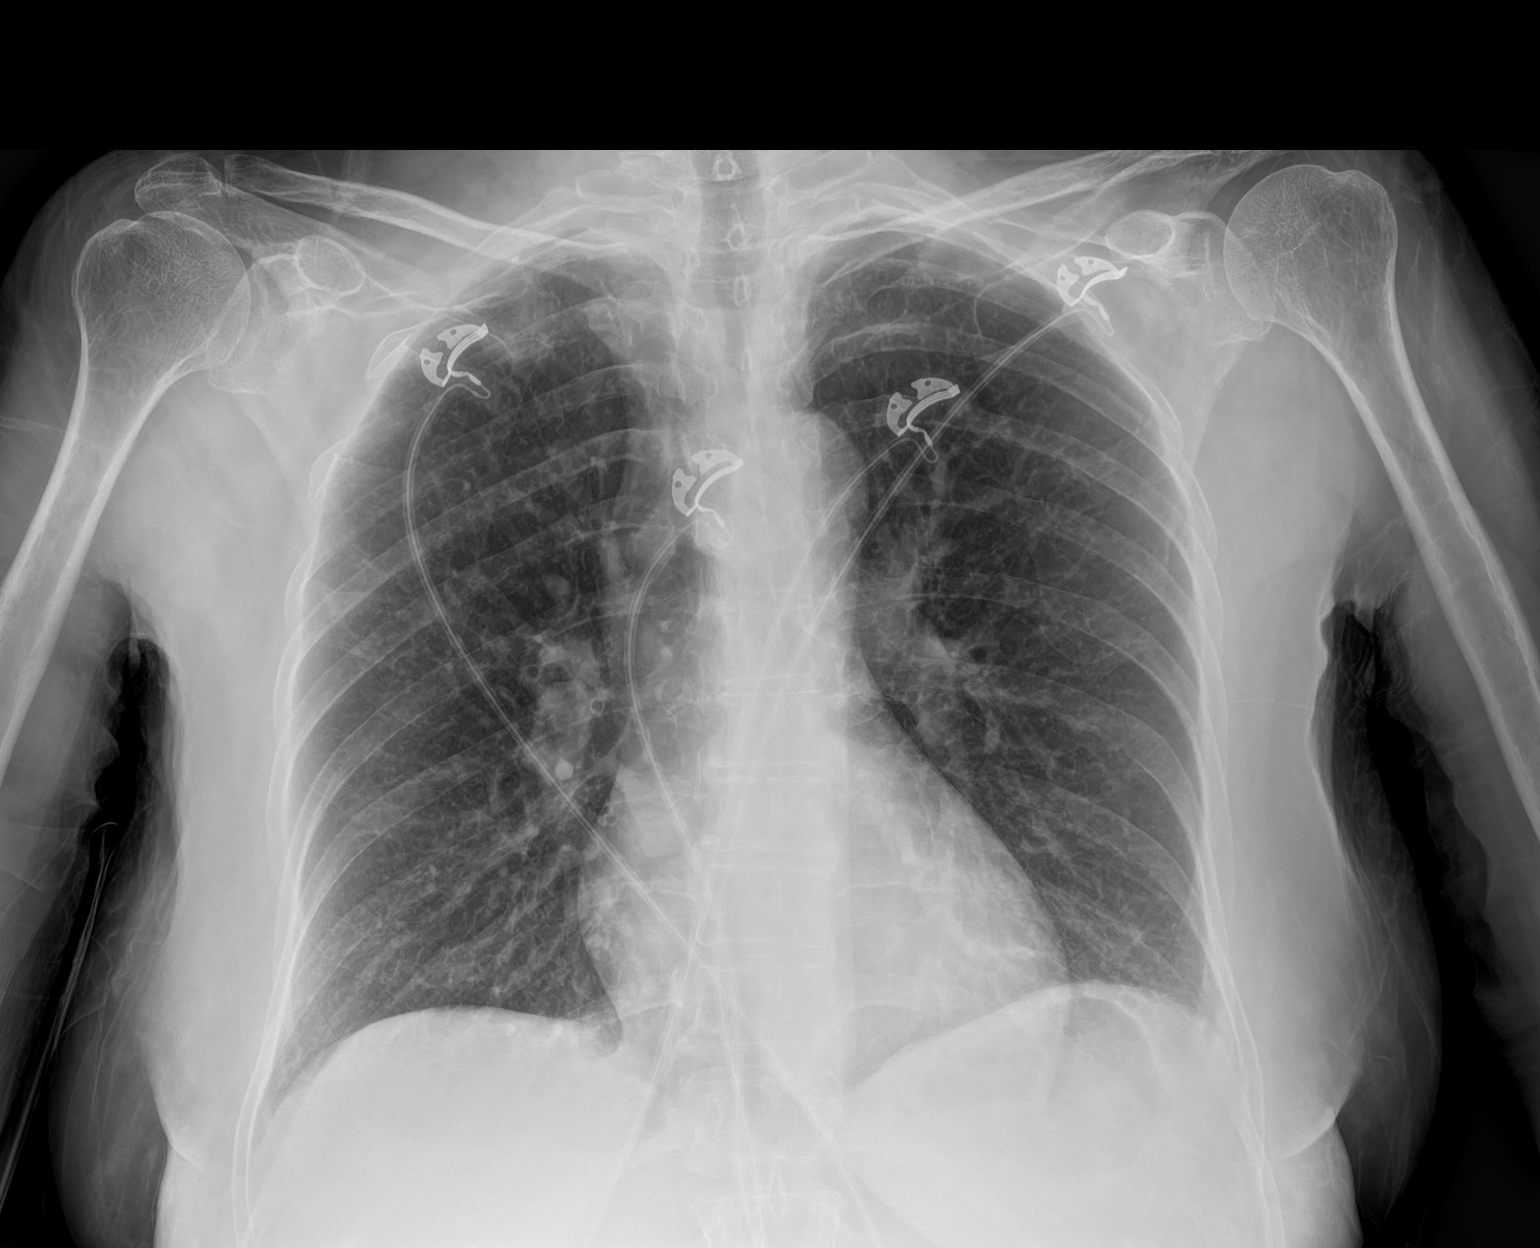

[1 of 1 positions shown; findings below may reference images not displayed]

FINDINGS: The heart size and mediastinal contours are within normal limits.
Both lungs are clear. The visualized skeletal structures are
unremarkable.
IMPRESSION: No active disease.

## 2022-12-08 IMAGING — US US RENAL
1 series · 13 of 25 positions shown · non-contrast
Comparison: CT evaluation August 12, 2021.

CLINICAL DATA: A 65-year-old female presents for evaluation of
abnormality seen on the recent CT evaluation.

EXAM:
RENAL / URINARY TRACT ULTRASOUND COMPLETE

[Series 1: us renal · 13 of 61 slices shown]
[im 1/61]
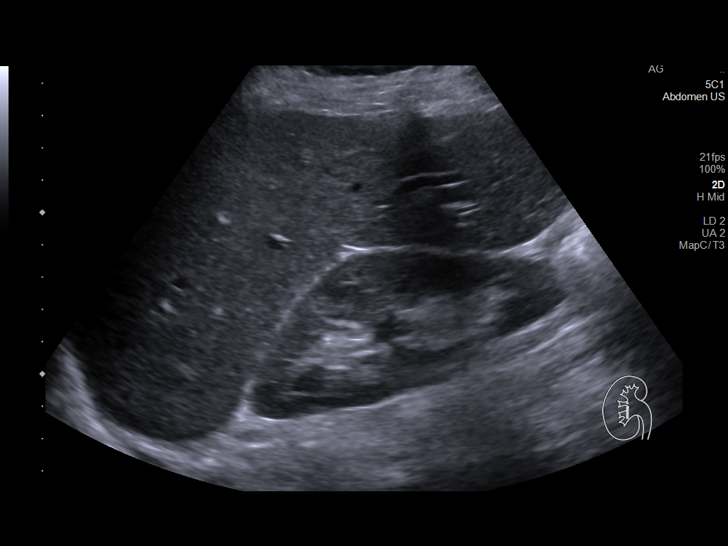
[im 6/61]
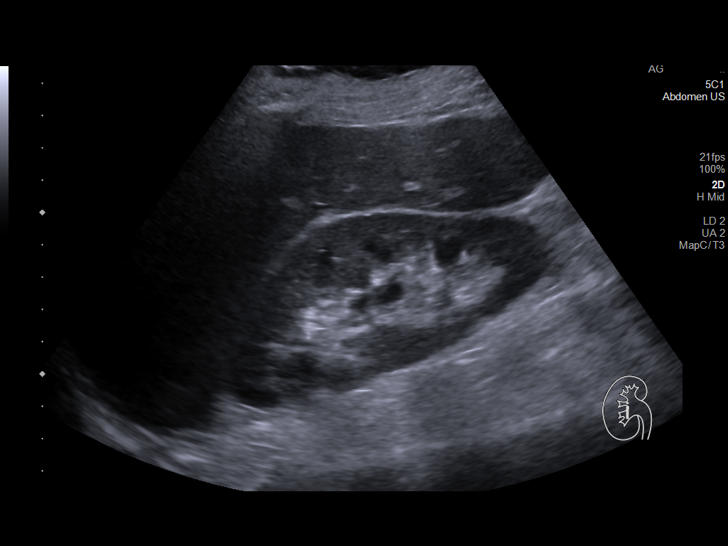
[im 11/61]
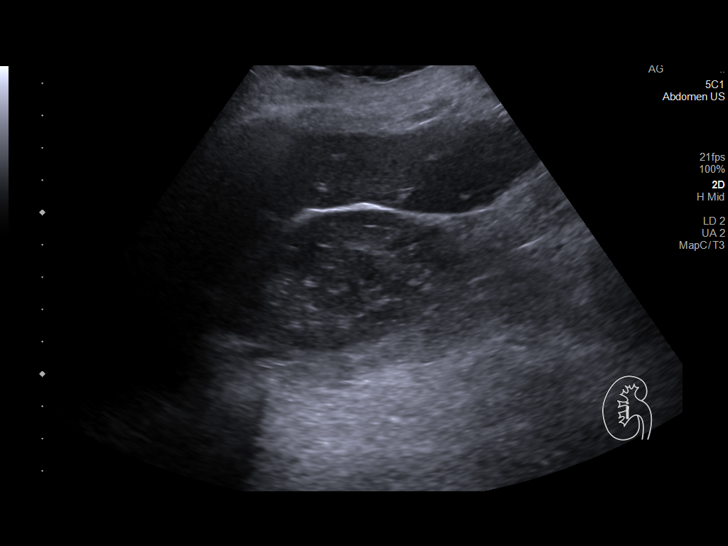
[im 16/61]
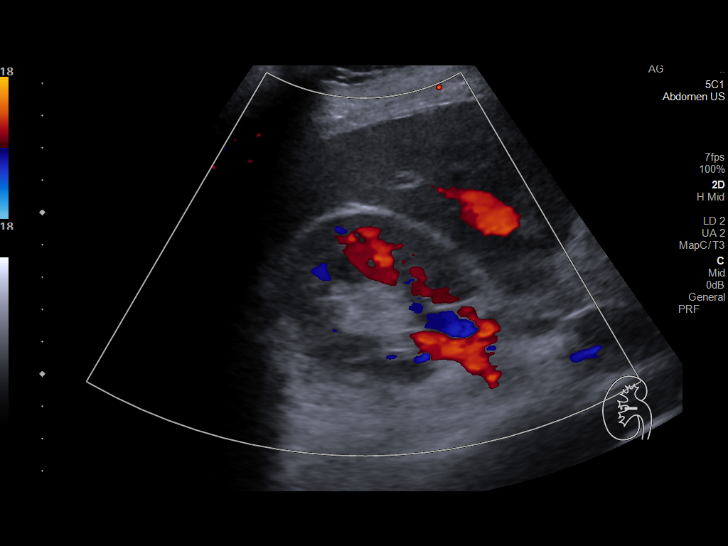
[im 21/61]
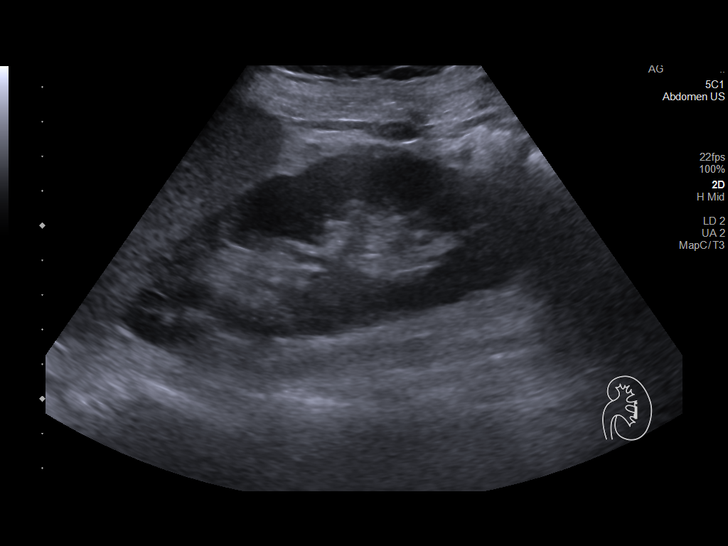
[im 26/61]
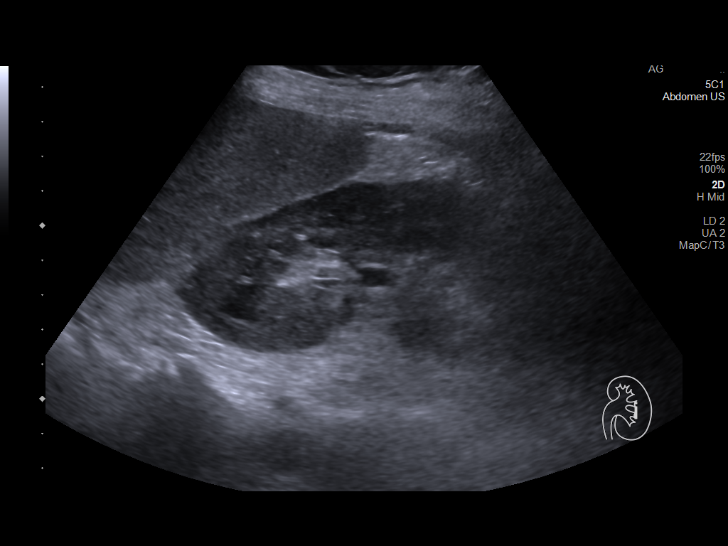
[im 31/61]
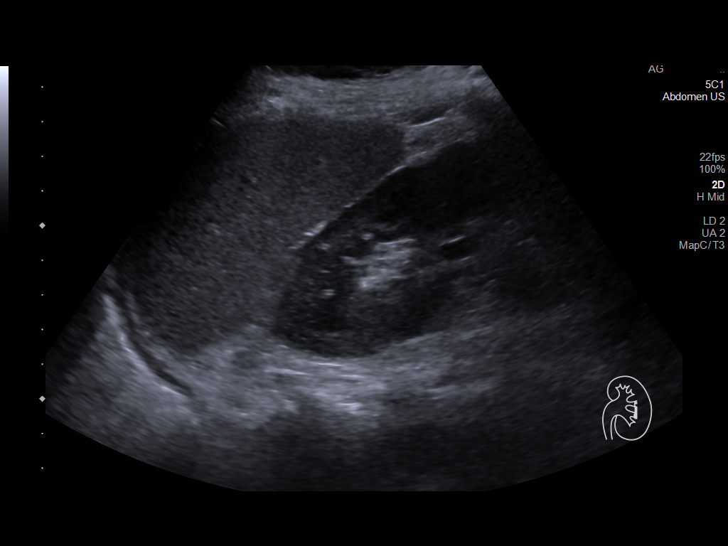
[im 36/61]
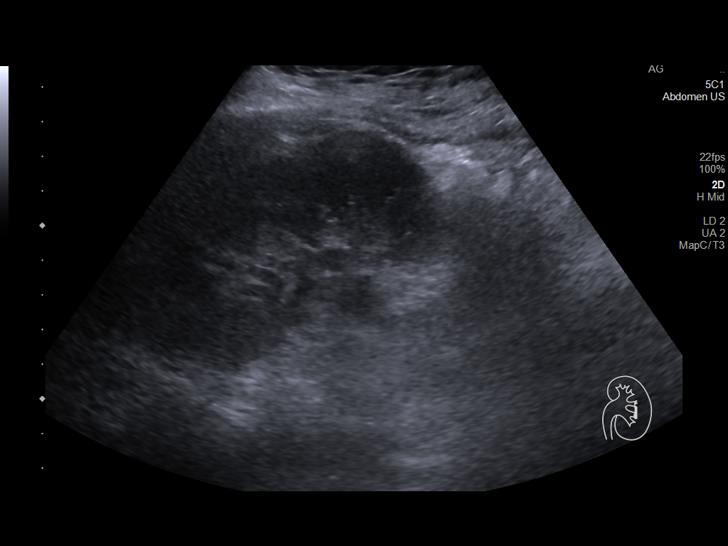
[im 41/61]
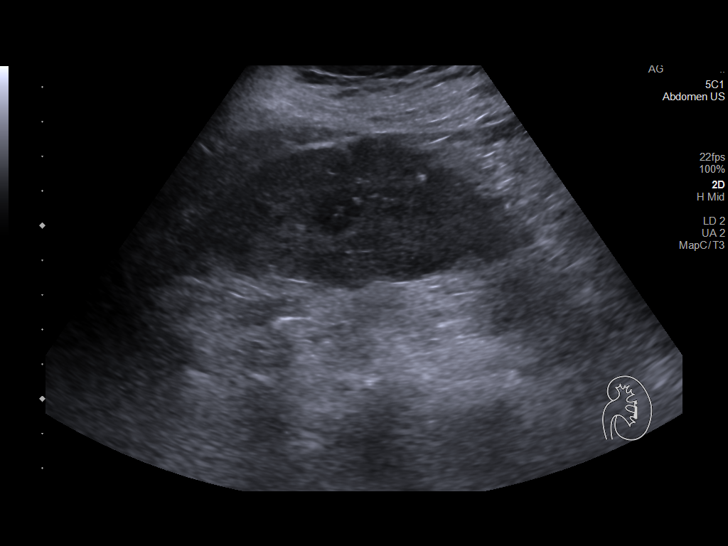
[im 46/61]
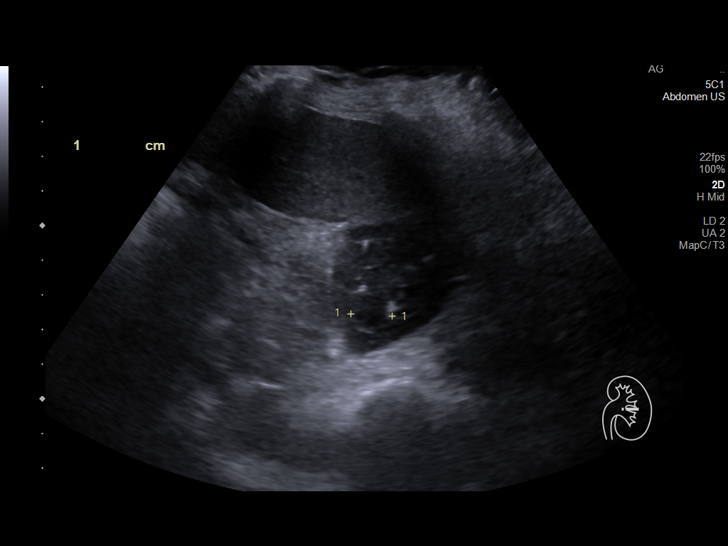
[im 51/61]
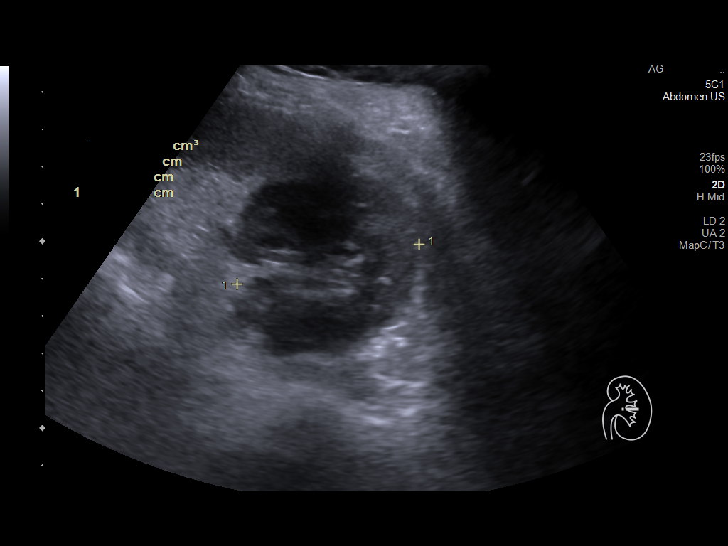
[im 56/61]
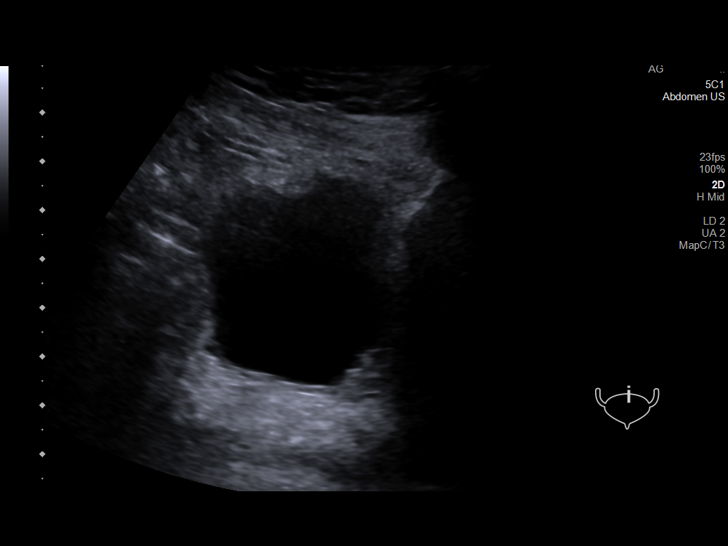
[im 61/61]
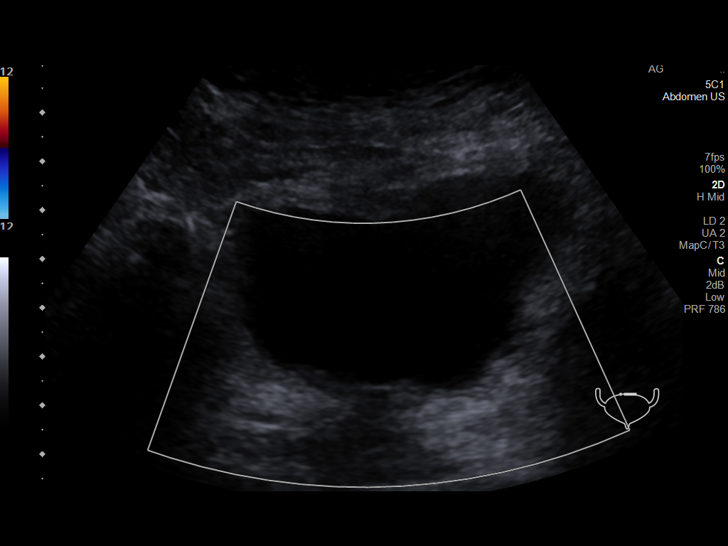

[13 of 25 positions shown; findings below may reference images not displayed]

FINDINGS: Right Kidney:

Renal measurements: 11.0 x 5.2 x 6.1 cm = volume: 23.4 mL.
Echogenicity within normal limits. No mass or hydronephrosis
visualized.

Left Kidney:

Renal measurements: 11.3 x 5.2 x 5.0 cm = volume: 153 mL.
Echogenicity is grossly unremarkable throughout the LEFT kidney.
There is no sign of hydronephrosis.

Anechoic area in the upper pole of the RIGHT kidney with enhanced
through transmission. This measures 1.1 x 1.1 x 1.2 cm the
interpolar areas not visible currently perhaps due to sonographic
window.

Bladder:

Unremarkable

Other:

None.
IMPRESSION: No large collection in the LEFT kidney. Small anechoic area in the
upper pole is shown to reside within an area of focal nephritis on
the previous study and while potentially this could represent an
abscess it is small at 1.1 cm greatest dimension. Differential
considerations are unchanged and include a cyst amidst changes of
focal nephritis. Urologic consultation may be helpful to guide
management.

Interpolar area seen also on the prior study is not visible on the
current exam and ultrasound imaging does not change the differential
in either of these areas. Other areas of focal nephritis not
appreciated by ultrasound due to patient body habitus.

## 2022-12-13 ENCOUNTER — Other Ambulatory Visit (HOSPITAL_COMMUNITY): Payer: Self-pay | Admitting: Internal Medicine

## 2022-12-13 DIAGNOSIS — Z1231 Encounter for screening mammogram for malignant neoplasm of breast: Secondary | ICD-10-CM

## 2023-01-10 IMAGING — US US RENAL
1 series · 14 of 25 positions shown · non-contrast
Comparison: Renal ultrasound August 14, 2021

CLINICAL DATA: Previous left renal abscess.

EXAM:
RENAL / URINARY TRACT ULTRASOUND COMPLETE

[Series 1: us renal · 14 of 42 slices shown]
[im 1/42]
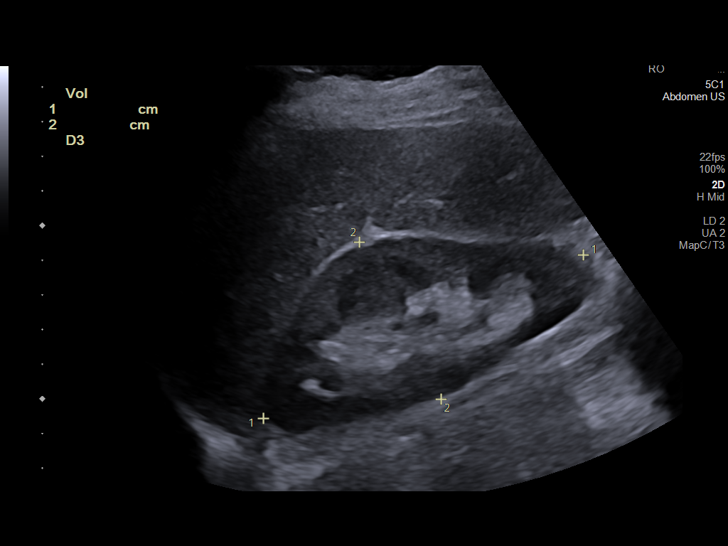
[im 4/42]
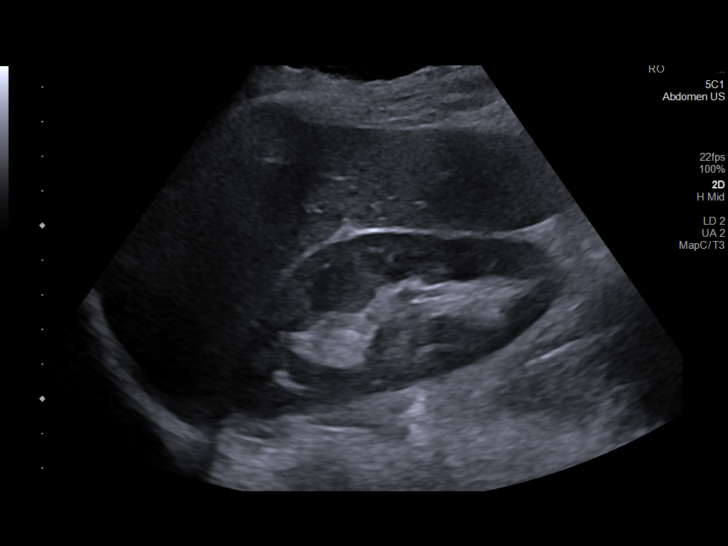
[im 7/42]
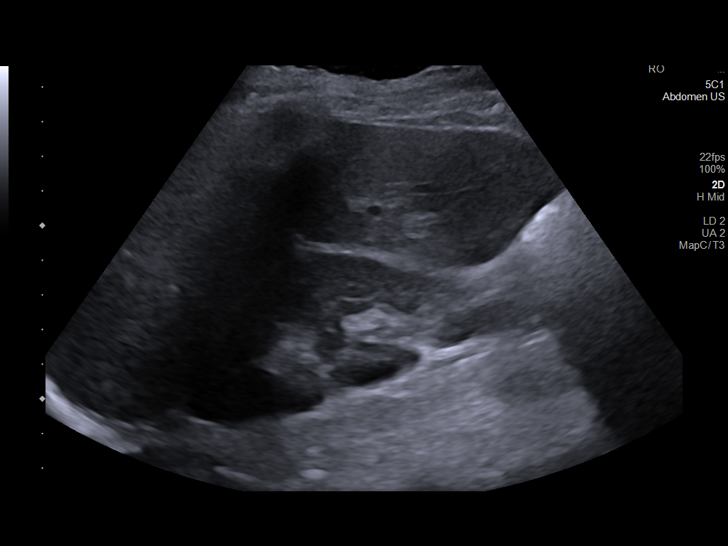
[im 11/42]
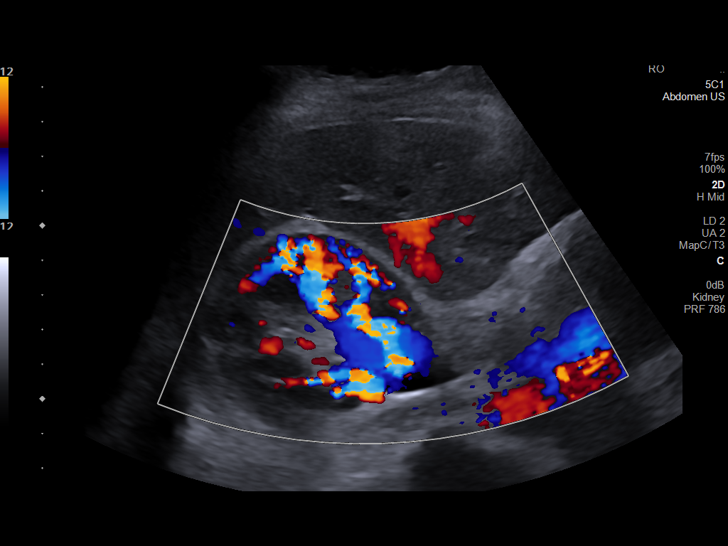
[im 14/42]
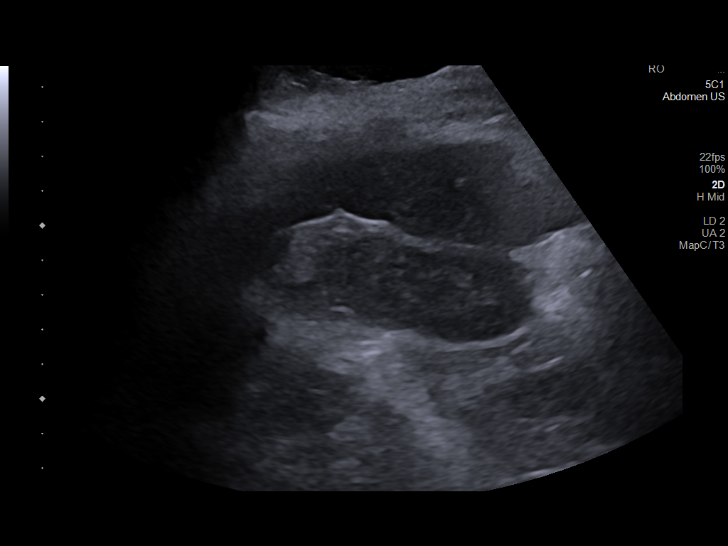
[im 16/42]
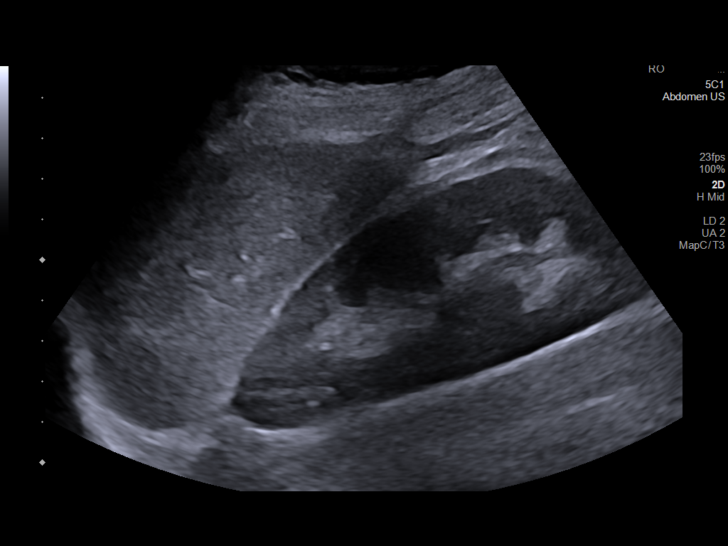
[im 19/42]
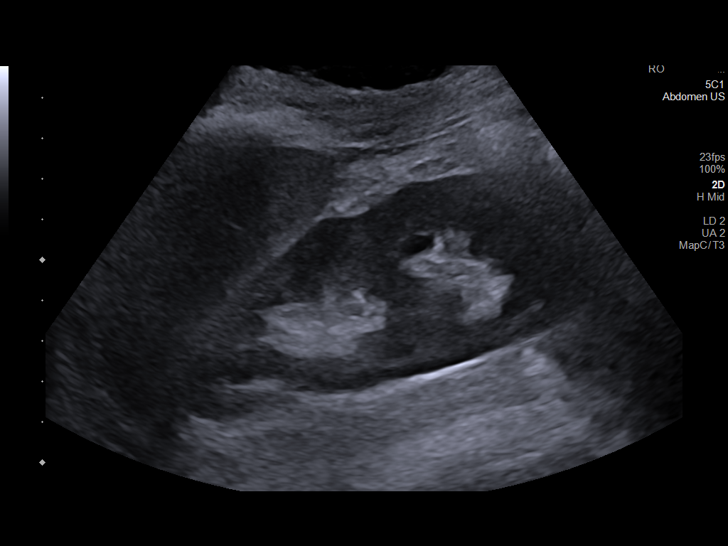
[im 23/42]
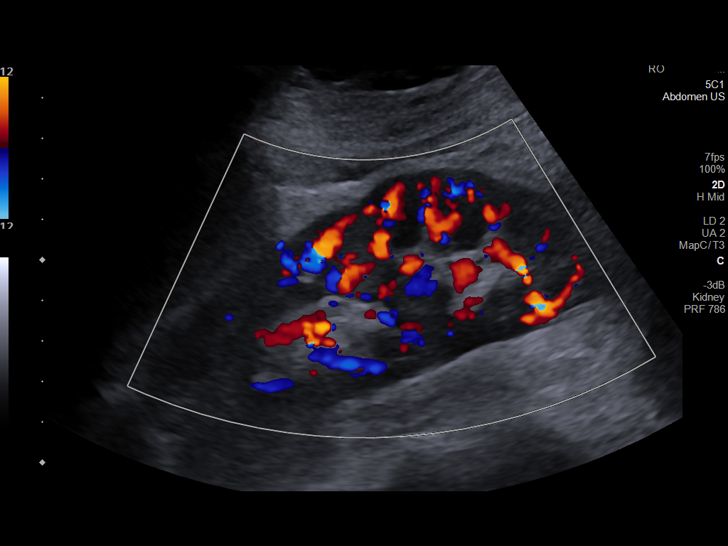
[im 26/42]
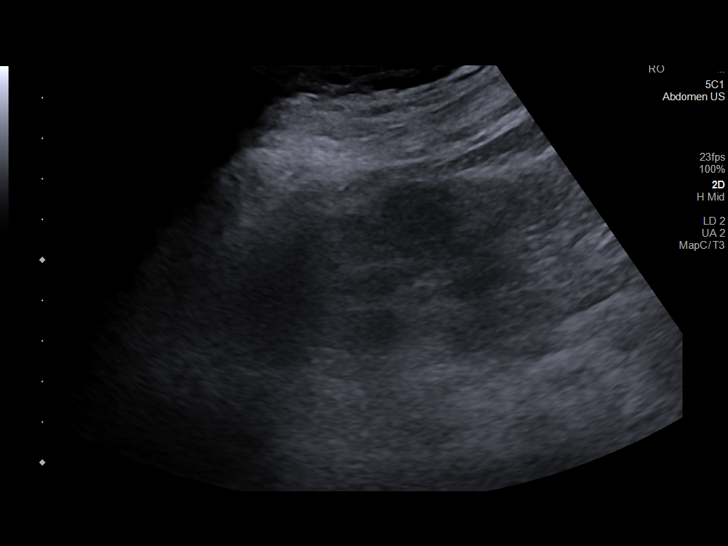
[im 28/42]
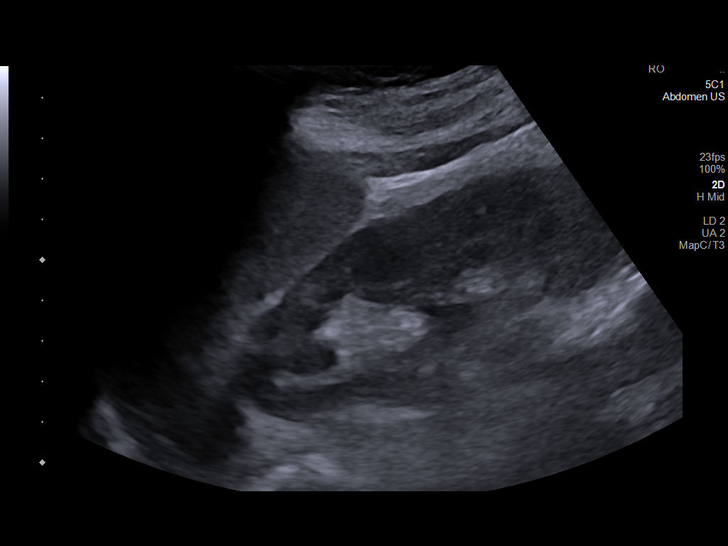
[im 31/42]
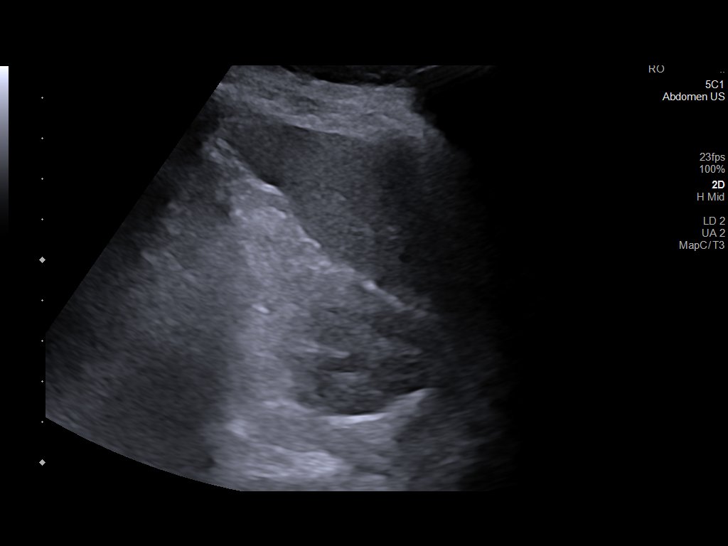
[im 35/42]
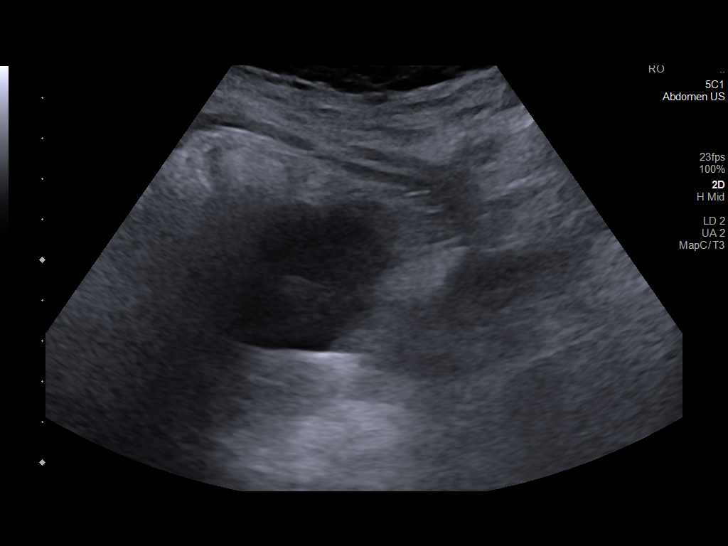
[im 38/42]
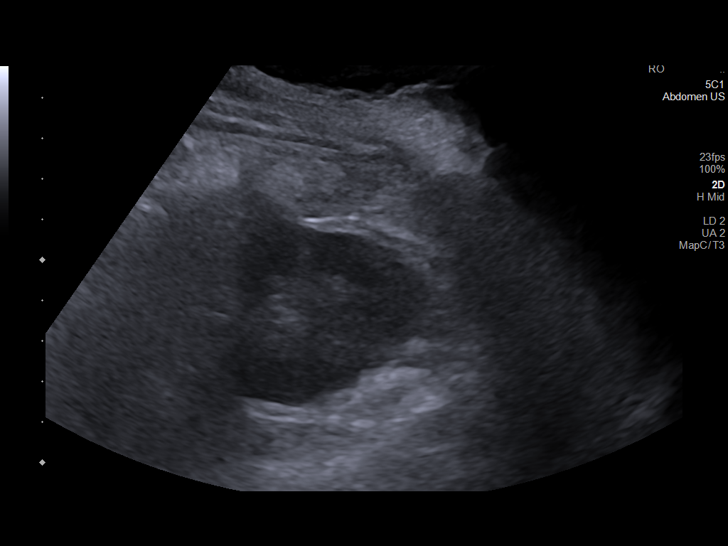
[im 42/42]
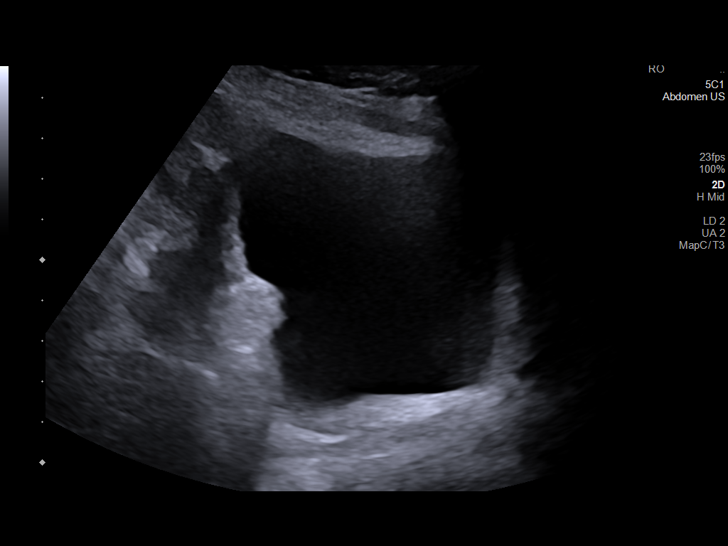

[14 of 25 positions shown; findings below may reference images not displayed]

FINDINGS: Right Kidney:

Renal measurements: 10.4 x 5.1 x 6.4 cm = volume: 177 mL.
Echogenicity within normal limits. No mass or hydronephrosis
visualized.

Left Kidney:

Renal measurements: 11.2 x 4.7 x 4.1 cm = volume: 112 mL.
Echogenicity within normal limits. No mass or hydronephrosis
visualized.

Bladder:

Appears normal for degree of bladder distention.

Other:

None.
IMPRESSION: Normal study.  No renal abscess identified.

## 2023-01-14 ENCOUNTER — Ambulatory Visit (HOSPITAL_COMMUNITY): Payer: BC Managed Care – PPO

## 2023-03-24 ENCOUNTER — Ambulatory Visit (HOSPITAL_COMMUNITY)
Admission: RE | Admit: 2023-03-24 | Discharge: 2023-03-24 | Disposition: A | Discharge status: Home / Self Care | Payer: Medicare PPO | Source: Ambulatory Visit | Attending: Internal Medicine | Admitting: Internal Medicine

## 2023-03-24 DIAGNOSIS — Z1231 Encounter for screening mammogram for malignant neoplasm of breast: Secondary | ICD-10-CM | POA: Diagnosis not present

## 2023-05-06 DIAGNOSIS — E559 Vitamin D deficiency, unspecified: Secondary | ICD-10-CM | POA: Diagnosis not present

## 2023-05-06 DIAGNOSIS — Z0000 Encounter for general adult medical examination without abnormal findings: Secondary | ICD-10-CM | POA: Diagnosis not present

## 2023-05-06 DIAGNOSIS — E782 Mixed hyperlipidemia: Secondary | ICD-10-CM | POA: Diagnosis not present

## 2023-05-06 DIAGNOSIS — R7301 Impaired fasting glucose: Secondary | ICD-10-CM | POA: Diagnosis not present

## 2023-05-06 DIAGNOSIS — D649 Anemia, unspecified: Secondary | ICD-10-CM | POA: Diagnosis not present

## 2023-05-11 DIAGNOSIS — Z8719 Personal history of other diseases of the digestive system: Secondary | ICD-10-CM | POA: Diagnosis not present

## 2023-05-11 DIAGNOSIS — I10 Essential (primary) hypertension: Secondary | ICD-10-CM | POA: Diagnosis not present

## 2023-05-11 DIAGNOSIS — Z0001 Encounter for general adult medical examination with abnormal findings: Secondary | ICD-10-CM | POA: Diagnosis not present

## 2023-05-11 DIAGNOSIS — R7301 Impaired fasting glucose: Secondary | ICD-10-CM | POA: Diagnosis not present

## 2023-05-11 DIAGNOSIS — Z6822 Body mass index (BMI) 22.0-22.9, adult: Secondary | ICD-10-CM | POA: Diagnosis not present

## 2023-05-11 DIAGNOSIS — E559 Vitamin D deficiency, unspecified: Secondary | ICD-10-CM | POA: Diagnosis not present

## 2023-05-11 DIAGNOSIS — D649 Anemia, unspecified: Secondary | ICD-10-CM | POA: Diagnosis not present

## 2023-05-11 DIAGNOSIS — E782 Mixed hyperlipidemia: Secondary | ICD-10-CM | POA: Diagnosis not present

## 2023-05-11 DIAGNOSIS — H8103 Meniere's disease, bilateral: Secondary | ICD-10-CM | POA: Diagnosis not present

## 2023-11-04 DIAGNOSIS — R7301 Impaired fasting glucose: Secondary | ICD-10-CM | POA: Diagnosis not present

## 2023-11-04 DIAGNOSIS — D649 Anemia, unspecified: Secondary | ICD-10-CM | POA: Diagnosis not present

## 2023-11-04 DIAGNOSIS — E559 Vitamin D deficiency, unspecified: Secondary | ICD-10-CM | POA: Diagnosis not present

## 2023-11-04 DIAGNOSIS — E782 Mixed hyperlipidemia: Secondary | ICD-10-CM | POA: Diagnosis not present

## 2023-11-09 DIAGNOSIS — R7301 Impaired fasting glucose: Secondary | ICD-10-CM | POA: Diagnosis not present

## 2023-11-09 DIAGNOSIS — I10 Essential (primary) hypertension: Secondary | ICD-10-CM | POA: Diagnosis not present

## 2023-11-09 DIAGNOSIS — E871 Hypo-osmolality and hyponatremia: Secondary | ICD-10-CM | POA: Diagnosis not present

## 2023-11-09 DIAGNOSIS — D649 Anemia, unspecified: Secondary | ICD-10-CM | POA: Diagnosis not present

## 2023-11-09 DIAGNOSIS — Z6822 Body mass index (BMI) 22.0-22.9, adult: Secondary | ICD-10-CM | POA: Diagnosis not present

## 2023-11-09 DIAGNOSIS — E782 Mixed hyperlipidemia: Secondary | ICD-10-CM | POA: Diagnosis not present

## 2023-11-09 DIAGNOSIS — Z8719 Personal history of other diseases of the digestive system: Secondary | ICD-10-CM | POA: Diagnosis not present

## 2023-11-09 DIAGNOSIS — E559 Vitamin D deficiency, unspecified: Secondary | ICD-10-CM | POA: Diagnosis not present

## 2023-11-09 DIAGNOSIS — H8103 Meniere's disease, bilateral: Secondary | ICD-10-CM | POA: Diagnosis not present
